# Patient Record
Sex: Female | Born: 1950 | Race: White | Hispanic: No | Marital: Married | Smoking: Never smoker
Health system: Southern US, Community
[De-identification: ages and names within clinical notes are randomized; demographics above are authoritative.]

## PROBLEM LIST (undated history)

## (undated) DIAGNOSIS — Z9889 Other specified postprocedural states: Secondary | ICD-10-CM

## (undated) DIAGNOSIS — T8859XA Other complications of anesthesia, initial encounter: Secondary | ICD-10-CM

## (undated) DIAGNOSIS — Z87442 Personal history of urinary calculi: Secondary | ICD-10-CM

## (undated) DIAGNOSIS — K529 Noninfective gastroenteritis and colitis, unspecified: Secondary | ICD-10-CM

## (undated) DIAGNOSIS — N390 Urinary tract infection, site not specified: Secondary | ICD-10-CM

## (undated) DIAGNOSIS — T4145XA Adverse effect of unspecified anesthetic, initial encounter: Secondary | ICD-10-CM

## (undated) DIAGNOSIS — M1990 Unspecified osteoarthritis, unspecified site: Secondary | ICD-10-CM

## (undated) DIAGNOSIS — N200 Calculus of kidney: Secondary | ICD-10-CM

## (undated) DIAGNOSIS — R112 Nausea with vomiting, unspecified: Secondary | ICD-10-CM

## (undated) DIAGNOSIS — N3010 Interstitial cystitis (chronic) without hematuria: Secondary | ICD-10-CM

## (undated) HISTORY — DX: Urinary tract infection, site not specified: N39.0

## (undated) HISTORY — PX: BREAST BIOPSY: SHX20

## (undated) HISTORY — PX: ABDOMINAL HYSTERECTOMY: SHX81

## (undated) HISTORY — DX: Calculus of kidney: N20.0

## (undated) HISTORY — PX: JOINT REPLACEMENT: SHX530

## (undated) HISTORY — DX: Interstitial cystitis (chronic) without hematuria: N30.10

## (undated) HISTORY — DX: Noninfective gastroenteritis and colitis, unspecified: K52.9

---

## 1988-02-06 HISTORY — PX: PARTIAL HYSTERECTOMY: SHX80

## 1989-02-05 HISTORY — PX: APPENDECTOMY: SHX54

## 1999-02-07 ENCOUNTER — Encounter: Admission: RE | Admit: 1999-02-07 | Discharge: 1999-02-07 | Payer: Self-pay | Admitting: Gynecology

## 1999-02-07 ENCOUNTER — Encounter: Payer: Self-pay | Admitting: Gynecology

## 1999-09-21 ENCOUNTER — Other Ambulatory Visit: Admission: RE | Admit: 1999-09-21 | Discharge: 1999-09-21 | Payer: Self-pay | Admitting: Gynecology

## 2000-02-15 ENCOUNTER — Encounter: Payer: Self-pay | Admitting: Gynecology

## 2000-02-15 ENCOUNTER — Encounter: Admission: RE | Admit: 2000-02-15 | Discharge: 2000-02-15 | Payer: Self-pay | Admitting: Gynecology

## 2001-02-21 ENCOUNTER — Encounter: Admission: RE | Admit: 2001-02-21 | Discharge: 2001-02-21 | Payer: Self-pay | Admitting: Gynecology

## 2001-02-21 ENCOUNTER — Encounter: Payer: Self-pay | Admitting: Gynecology

## 2002-02-23 ENCOUNTER — Encounter: Payer: Self-pay | Admitting: Gynecology

## 2002-02-23 ENCOUNTER — Encounter: Admission: RE | Admit: 2002-02-23 | Discharge: 2002-02-23 | Payer: Self-pay | Admitting: Gynecology

## 2002-10-26 ENCOUNTER — Other Ambulatory Visit: Admission: RE | Admit: 2002-10-26 | Discharge: 2002-10-26 | Payer: Self-pay | Admitting: Gynecology

## 2003-03-05 ENCOUNTER — Encounter: Admission: RE | Admit: 2003-03-05 | Discharge: 2003-03-05 | Payer: Self-pay | Admitting: Gynecology

## 2004-03-06 ENCOUNTER — Encounter: Admission: RE | Admit: 2004-03-06 | Discharge: 2004-03-06 | Payer: Self-pay | Admitting: Gynecology

## 2004-03-17 ENCOUNTER — Encounter: Admission: RE | Admit: 2004-03-17 | Discharge: 2004-03-17 | Payer: Self-pay | Admitting: Gynecology

## 2005-03-12 ENCOUNTER — Encounter: Admission: RE | Admit: 2005-03-12 | Discharge: 2005-03-12 | Payer: Self-pay | Admitting: Gynecology

## 2006-03-13 ENCOUNTER — Encounter: Admission: RE | Admit: 2006-03-13 | Discharge: 2006-03-13 | Payer: Self-pay | Admitting: Gynecology

## 2007-03-17 ENCOUNTER — Encounter: Admission: RE | Admit: 2007-03-17 | Discharge: 2007-03-17 | Payer: Self-pay | Admitting: Gynecology

## 2007-03-25 ENCOUNTER — Encounter: Admission: RE | Admit: 2007-03-25 | Discharge: 2007-03-25 | Payer: Self-pay | Admitting: Gynecology

## 2008-03-17 ENCOUNTER — Encounter: Admission: RE | Admit: 2008-03-17 | Discharge: 2008-03-17 | Payer: Self-pay | Admitting: Gynecology

## 2009-03-21 ENCOUNTER — Encounter: Admission: RE | Admit: 2009-03-21 | Discharge: 2009-03-21 | Payer: Self-pay | Admitting: Gynecology

## 2010-02-25 ENCOUNTER — Other Ambulatory Visit: Payer: Self-pay | Admitting: Gynecology

## 2010-02-25 DIAGNOSIS — Z1231 Encounter for screening mammogram for malignant neoplasm of breast: Secondary | ICD-10-CM

## 2010-03-22 ENCOUNTER — Ambulatory Visit
Admission: RE | Admit: 2010-03-22 | Discharge: 2010-03-22 | Disposition: A | Discharge status: Home / Self Care | Payer: BC Managed Care – PPO | Source: Ambulatory Visit | Attending: Gynecology | Admitting: Gynecology

## 2010-03-22 DIAGNOSIS — Z1231 Encounter for screening mammogram for malignant neoplasm of breast: Secondary | ICD-10-CM

## 2010-03-28 ENCOUNTER — Other Ambulatory Visit: Payer: Self-pay | Admitting: Gynecology

## 2010-03-28 DIAGNOSIS — R928 Other abnormal and inconclusive findings on diagnostic imaging of breast: Secondary | ICD-10-CM

## 2010-03-29 ENCOUNTER — Ambulatory Visit
Admission: RE | Admit: 2010-03-29 | Discharge: 2010-03-29 | Disposition: A | Discharge status: Home / Self Care | Payer: BC Managed Care – PPO | Source: Ambulatory Visit | Attending: Gynecology | Admitting: Gynecology

## 2010-03-29 DIAGNOSIS — R928 Other abnormal and inconclusive findings on diagnostic imaging of breast: Secondary | ICD-10-CM

## 2011-02-20 ENCOUNTER — Other Ambulatory Visit: Payer: Self-pay | Admitting: Gynecology

## 2011-02-20 DIAGNOSIS — Z1231 Encounter for screening mammogram for malignant neoplasm of breast: Secondary | ICD-10-CM

## 2011-03-01 ENCOUNTER — Other Ambulatory Visit: Payer: Self-pay | Admitting: Gynecology

## 2011-03-08 ENCOUNTER — Ambulatory Visit
Admission: RE | Admit: 2011-03-08 | Discharge: 2011-03-08 | Disposition: A | Discharge status: Home / Self Care | Payer: BC Managed Care – PPO | Source: Ambulatory Visit | Attending: Gynecology | Admitting: Gynecology

## 2011-04-02 ENCOUNTER — Ambulatory Visit
Admission: RE | Admit: 2011-04-02 | Discharge: 2011-04-02 | Disposition: A | Discharge status: Home / Self Care | Payer: BC Managed Care – PPO | Source: Ambulatory Visit | Attending: Gynecology | Admitting: Gynecology

## 2011-04-02 DIAGNOSIS — Z1231 Encounter for screening mammogram for malignant neoplasm of breast: Secondary | ICD-10-CM

## 2011-05-09 ENCOUNTER — Other Ambulatory Visit: Payer: Self-pay | Admitting: Gynecology

## 2011-05-09 DIAGNOSIS — N6009 Solitary cyst of unspecified breast: Secondary | ICD-10-CM

## 2011-05-14 ENCOUNTER — Other Ambulatory Visit: Payer: Self-pay | Admitting: Diagnostic Radiology

## 2011-05-14 ENCOUNTER — Ambulatory Visit
Admission: RE | Admit: 2011-05-14 | Discharge: 2011-05-14 | Disposition: A | Discharge status: Home / Self Care | Payer: BC Managed Care – PPO | Source: Ambulatory Visit | Attending: Gynecology | Admitting: Gynecology

## 2011-05-14 ENCOUNTER — Other Ambulatory Visit: Payer: Self-pay | Admitting: Gynecology

## 2011-05-14 DIAGNOSIS — N6009 Solitary cyst of unspecified breast: Secondary | ICD-10-CM

## 2011-11-08 ENCOUNTER — Encounter: Payer: Self-pay | Admitting: Internal Medicine

## 2012-03-03 ENCOUNTER — Other Ambulatory Visit: Payer: Self-pay | Admitting: Gynecology

## 2012-03-03 DIAGNOSIS — Z1231 Encounter for screening mammogram for malignant neoplasm of breast: Secondary | ICD-10-CM

## 2012-04-03 ENCOUNTER — Ambulatory Visit
Admission: RE | Admit: 2012-04-03 | Discharge: 2012-04-03 | Disposition: A | Discharge status: Home / Self Care | Payer: BC Managed Care – PPO | Source: Ambulatory Visit | Attending: Gynecology | Admitting: Gynecology

## 2012-04-03 DIAGNOSIS — Z1231 Encounter for screening mammogram for malignant neoplasm of breast: Secondary | ICD-10-CM

## 2012-05-16 ENCOUNTER — Encounter: Payer: Self-pay | Admitting: Internal Medicine

## 2012-07-11 ENCOUNTER — Ambulatory Visit (AMBULATORY_SURGERY_CENTER): Payer: BC Managed Care – PPO | Admitting: *Deleted

## 2012-07-11 ENCOUNTER — Encounter: Payer: Self-pay | Admitting: Internal Medicine

## 2012-07-11 VITALS — Ht 66.0 in | Wt 136.0 lb

## 2012-07-11 DIAGNOSIS — Z1211 Encounter for screening for malignant neoplasm of colon: Secondary | ICD-10-CM

## 2012-07-11 MED ORDER — MOVIPREP 100 G PO SOLR: ORAL | Status: DC

## 2012-07-11 NOTE — Progress Notes (Signed)
Patient concerned about hemorrhoids bleeding like during prep 10 years ago. Explained to patient OTC meds. To use and sitz bath to help. Also encouraged patient to call us or 911 if bleeding large amts or blood clots. She understands. Patient denies any problems with hemorrhoids currently, only when doing the colon prep.

## 2012-07-16 ENCOUNTER — Telehealth: Payer: Self-pay | Admitting: Internal Medicine

## 2012-07-16 NOTE — Telephone Encounter (Signed)
No charge. 

## 2012-07-18 ENCOUNTER — Encounter: Payer: BC Managed Care – PPO | Admitting: Internal Medicine

## 2013-01-05 ENCOUNTER — Telehealth: Payer: Self-pay | Admitting: Internal Medicine

## 2013-01-05 NOTE — Telephone Encounter (Signed)
Spoke with patient. She has had diarrhea when she has a bowel movement since September. No pain, gas or strong odor just loose stools. Wants a Friday OV. Scheduled patient on 01/09/13 at 3:30 PM with  Doug Sou, PA.

## 2013-01-08 ENCOUNTER — Encounter: Payer: Self-pay | Admitting: Gastroenterology

## 2013-01-08 ENCOUNTER — Ambulatory Visit (INDEPENDENT_AMBULATORY_CARE_PROVIDER_SITE_OTHER)
Admission: RE | Admit: 2013-01-08 | Discharge: 2013-01-08 | Disposition: A | Discharge status: Home / Self Care | Payer: BC Managed Care – PPO | Source: Ambulatory Visit | Attending: Gastroenterology | Admitting: Gastroenterology

## 2013-01-08 ENCOUNTER — Other Ambulatory Visit (INDEPENDENT_AMBULATORY_CARE_PROVIDER_SITE_OTHER): Payer: BC Managed Care – PPO

## 2013-01-08 ENCOUNTER — Ambulatory Visit (INDEPENDENT_AMBULATORY_CARE_PROVIDER_SITE_OTHER): Payer: BC Managed Care – PPO | Admitting: Gastroenterology

## 2013-01-08 VITALS — BP 116/80 | HR 72 | Ht 66.0 in | Wt 129.0 lb

## 2013-01-08 DIAGNOSIS — R1032 Left lower quadrant pain: Secondary | ICD-10-CM

## 2013-01-08 DIAGNOSIS — K625 Hemorrhage of anus and rectum: Secondary | ICD-10-CM

## 2013-01-08 DIAGNOSIS — R197 Diarrhea, unspecified: Secondary | ICD-10-CM

## 2013-01-08 LAB — CBC WITH DIFFERENTIAL/PLATELET
Basophils Absolute: 0 10*3/uL (ref 0.0–0.1)
Eosinophils Absolute: 0 10*3/uL (ref 0.0–0.7)
Hemoglobin: 14.2 g/dL (ref 12.0–15.0)
Lymphocytes Relative: 6.5 % — ABNORMAL LOW (ref 12.0–46.0)
MCHC: 33.5 g/dL (ref 30.0–36.0)
Monocytes Relative: 2.4 % — ABNORMAL LOW (ref 3.0–12.0)
Neutro Abs: 7.1 10*3/uL (ref 1.4–7.7)
Neutrophils Relative %: 90.8 % — ABNORMAL HIGH (ref 43.0–77.0)
RDW: 12.8 % (ref 11.5–14.6)

## 2013-01-08 LAB — COMPREHENSIVE METABOLIC PANEL
ALT: 18 U/L (ref 0–35)
AST: 18 U/L (ref 0–37)
Albumin: 3.6 g/dL (ref 3.5–5.2)
Calcium: 8.8 mg/dL (ref 8.4–10.5)
Chloride: 103 mEq/L (ref 96–112)
Potassium: 4 mEq/L (ref 3.5–5.1)
Sodium: 137 mEq/L (ref 135–145)

## 2013-01-08 MED ORDER — IOHEXOL 300 MG/ML  SOLN
100.0000 mL | Freq: Once | INTRAMUSCULAR | Status: AC | PRN
  Administered 2013-01-08: 100 mL via INTRAVENOUS

## 2013-01-08 MED ORDER — DICYCLOMINE HCL 10 MG PO CAPS: 10.0000 mg | ORAL_CAPSULE | Freq: Three times a day (TID) | ORAL | Status: DC

## 2013-01-08 NOTE — Patient Instructions (Addendum)
You have been scheduled for a CT scan of the abdomen and pelvis at [facility] ([address]---this is in the same building as [facility]).   You are scheduled on 01/08/2013 at 4pm. You should arrive 15 minutes prior to your appointment time for registration. Please follow the written instructions below on the day of your exam:  WARNING: IF YOU ARE ALLERGIC TO IODINE/X-RAY DYE, PLEASE NOTIFY RADIOLOGY IMMEDIATELY AT [phone]! YOU WILL BE GIVEN A 13 HOUR PREMEDICATION PREP.  1) Do not eat or drink anything after 12pm (4 hours prior to your test) 2) You have been given 2 bottles of oral contrast to drink. The solution may taste               better if refrigerated, but do NOT add ice or any other liquid to this solution. Shake             well before drinking.    Drink 1 bottle of contrast @ 2pm (2 hours prior to your exam)  Drink 1 bottle of contrast @ 3pm (1 hour prior to your exam)  You may take any medications as prescribed with a small amount of water except for the following: Metformin, Glucophage, Glucovance, Avandamet, Riomet, Fortamet, Actoplus Met, Janumet, Glumetza or Metaglip. The above medications must be held the day of the exam AND 48 hours after the exam.  The purpose of you drinking the oral contrast is to aid in the visualization of your intestinal tract. The contrast solution may cause some diarrhea. Before your exam is started, you will be given a small amount of fluid to drink. Depending on your individual set of symptoms, you may also receive an intravenous injection of x-ray contrast/dye. Plan on being at [facility] for 30 minutes or long, depending on the type of exam you are having performed.  This test typically takes 30-45 minutes to complete.  If you have any questions regarding your exam or if you need to reschedule, you may call the CT department at [phone] between the hours of 8:00 am and 5:00 pm,  Monday-Friday.  ________________________________________________________________________ Go to the basement for labs today.

## 2013-01-08 NOTE — Progress Notes (Signed)
Reviewed and agree with plans and assessment.

## 2013-01-08 NOTE — Progress Notes (Signed)
01/08/2013 Brandi Hines [id number] [birth year]   HISTORY OF PRESENT ILLNESS:  This is a pleasant 62 year old female who is known to Dr. Juanda Chance for colonoscopy in 12/2001. At that time she did have some erythema noted in the rectum, but biopsies were normal. She also had some hemorrhoids. Repeat was recommended in 10 years from that time. She is obviously overdue for the procedure, but states that her parents are elderly and she does take care of them and they had some health issues recently. Anyways, she comes in today with complaints of diarrhea for proximal last 5 weeks. She states that around the time the diarrhea began she was placed on some Synthroid medication, which she thought maybe causing diarrhea. That was discontinued after approximately a month and she still continues to have diarrhea. She says that she did go on a cruise prior to the diarrhea starting but she did not eat food that was not prepared or served on a cruise ship. She says that throughout the course of this diarrhea she would have some lower abdominal cramps, which were relieved by having a bowel movement. She is having 4-5 loose or watery bowel movements a day. She does take Imodium which helps temporarily. Very rare nocturnal bowel movements. This morning she woke up around 6:30 AM with lower abdominal cramping particularly in the left side radiating to the center that was reaching an intensity of 8/10 on the pain scale. She also noted some bright red blood and some mucousy material with her bowel movements today as well. She had not had any blood previously throughout the course of this diarrhea.  She denies fevers or chills. She had some dry heaves this morning, but otherwise no vomiting. She states that her appetite is good and she would like to eat but has been skeptical to eat several things due to fear of worsening the diarrhea    Past Medical History  Diagnosis Date  . Interstitial cystitis   . Kidney stones   . UTI  (lower urinary tract infection)    Past Surgical History  Procedure Laterality Date  . Partial hysterectomy  1990  . Appendectomy  1991    reports that she has never smoked. She has never used smokeless tobacco. She reports that she does not drink alcohol or use illicit drugs. family history includes Prostate cancer in her father. There is no history of Colon cancer or Colon polyps. No Known Allergies    Outpatient Encounter Prescriptions as of 01/08/2013  Medication Sig  . Cetirizine HCl (KLS ALLER-TEC PO) Take 1 tablet by mouth daily.  Marland Kitchen estrogens, conjugated, (PREMARIN) 0.45 MG tablet Take 0.45 mg by mouth daily. Take daily for 21 days then do not take for 7 days.  . [DISCONTINUED] MOVIPREP 100 G SOLR Take as directed.  . [DISCONTINUED] pentosan polysulfate (ELMIRON) 100 MG capsule Take 100 mg by mouth as directed.     REVIEW OF SYSTEMS  : All other systems reviewed and negative except where noted in the History of Present Illness.   PHYSICAL EXAM: BP 116/80  Pulse 72  Ht 5\' 6"  (1.676 m)  Wt 129 lb (58.514 kg)  BMI 20.83 kg/m2 General: Well developed white female in no acute distress Head: Normocephalic and atraumatic Eyes:  Sclerae anicteric, conjunctiva pink. Ears: Normal auditory acuity. Lungs: Clear throughout to auscultation Heart: Regular rate and rhythm Abdomen: Soft, non-distended.  Normal bowel sounds.  Lower abdominal TTP particularly in the LLQ and suprapubic region. Rectal:  DRE did not  reveal any masses.  There was trace amount of light brown stool on the exam glove, no blood.  Stool was heme positive, however. Musculoskeletal: Symmetrical with no gross deformities  Skin: No lesions on visible extremities Extremities: No edema  Neurological: Alert oriented x 4, grossly non-focal. Psychological:  Alert and cooperative. Normal mood and affect  ASSESSMENT AND PLAN: -Acute onset lower abdominal pain, particularly left lower quadrant with some mild rectal  bleeding -Diarrhea, present for a proximal 5 weeks  *We will order some labs including CBC and CMP. We'll have a CAT scan of abdomen and pelvis with contrast performed today to rule out diverticulitis, colitis of some sort, etc. We'll give her prescription for Bentyl 10 mg to begin taking 3 times daily as needed for the cramping. We will schedule her colonoscopy today as well since she is overdue for that and since that may be the next in evaluation if her CT scan is negative.

## 2013-01-09 ENCOUNTER — Other Ambulatory Visit: Payer: Self-pay | Admitting: *Deleted

## 2013-01-09 ENCOUNTER — Other Ambulatory Visit: Payer: BC Managed Care – PPO

## 2013-01-09 ENCOUNTER — Ambulatory Visit: Payer: BC Managed Care – PPO | Admitting: Gastroenterology

## 2013-01-09 DIAGNOSIS — K5289 Other specified noninfective gastroenteritis and colitis: Secondary | ICD-10-CM

## 2013-01-09 MED ORDER — CIPROFLOXACIN HCL 500 MG PO TABS: ORAL_TABLET | ORAL | Status: DC

## 2013-01-09 MED ORDER — METRONIDAZOLE 500 MG PO TABS: ORAL_TABLET | ORAL | Status: DC

## 2013-01-12 LAB — GASTROINTESTINAL PATHOGEN PANEL PCR
Campylobacter, PCR: NEGATIVE
E coli (STEC) stx1/stx2, PCR: NEGATIVE
E coli 0157, PCR: NEGATIVE
Giardia lamblia, PCR: NEGATIVE
Rotavirus A, PCR: NEGATIVE
Salmonella, PCR: NEGATIVE
Shigella, PCR: NEGATIVE

## 2013-01-13 ENCOUNTER — Encounter: Payer: Self-pay | Admitting: Internal Medicine

## 2013-01-22 ENCOUNTER — Other Ambulatory Visit: Payer: Self-pay | Admitting: Gastroenterology

## 2013-01-22 NOTE — Telephone Encounter (Signed)
Please advise regarding refill, thank you. 

## 2013-01-22 NOTE — Telephone Encounter (Signed)
Ok to refill dicyclomine.  Jess

## 2013-02-03 ENCOUNTER — Telehealth: Payer: Self-pay | Admitting: Gastroenterology

## 2013-02-03 NOTE — Telephone Encounter (Signed)
Haven't heard from Dr. Juanda Chance.  Please reschedule the patient's procedure for 1/8 at [facility] with conscious sedation.  Thank you,  Jess

## 2013-02-03 NOTE — Telephone Encounter (Signed)
Pt saw Doug Sou, PA on 12 /4/14 for abd pain LLQ, diarrhea x 5 weeks and rectal bleeding. Labs were drawn and a CT ordered that showed : 1. Abnormal thickening of the wall and enhancement of mucosa of the  right colon transverse colon and proximal sigmoid colon. Findings  are consistent with long segment significant colitis. No pericolonic  abscess or mesenteric fluid collection. Pathogen panel was normal and pt was placed on Cipro and Flagyl which she has completed. Pt has a COLON scheduled for 03/13/13 with Dr Juanda Chance. Pt reports cramping and bleeding for the 1st time since 01/08/13. She is taking Dicyclomine QID. She has a little formed stool, mostly it's blood and mucus. Shanda Bumps, please advise. Thanks.

## 2013-02-04 ENCOUNTER — Other Ambulatory Visit: Payer: Self-pay | Admitting: *Deleted

## 2013-02-04 DIAGNOSIS — R197 Diarrhea, unspecified: Secondary | ICD-10-CM

## 2013-02-04 DIAGNOSIS — K625 Hemorrhage of anus and rectum: Secondary | ICD-10-CM

## 2013-02-04 DIAGNOSIS — R109 Unspecified abdominal pain: Secondary | ICD-10-CM

## 2013-02-04 MED ORDER — PEG-KCL-NACL-NASULF-NA ASC-C 100 G PO SOLR: 1.0000 | Freq: Once | ORAL | Status: DC

## 2013-02-04 NOTE — Telephone Encounter (Signed)
Orders/case in computer, but no one at [facility] END[facility] to schedule. Informed pt I will call her 02/06/13 with final appt and she stated understanding.

## 2013-02-04 NOTE — Telephone Encounter (Signed)
Dr Juanda Chance, the room at [facility] is not open until before your 10am case and then after Dr Pyrtle's case at 11:30am. Dr Lauro Franklin case is a difficult one with intubation and I don't know how long it will take. Please help with a time if you want pt's case moved up to 02/12/13 as Shanda Bumps suggested. Just found out Dr Christella Hartigan is Hospital doc that day and won't be using his room. OK to have you follow in room 4 at 11am? Thanks, Aram Beecham

## 2013-02-04 NOTE — Telephone Encounter (Signed)
OK to schedule colon for 02/12/2013 at 11.00 am at [facility], conscious sedation

## 2013-02-06 NOTE — Progress Notes (Signed)
Pt left a message stating she got my VM and wonders if the prep has been called in. Spoke with pt to inform her Movi Prep is ready at [facility]; pt stated understanding.

## 2013-02-06 NOTE — Telephone Encounter (Signed)
Scheduled pt at [facility] and mailed new prep instructions to pt. lmom informing pt; she may call back for questions.

## 2013-02-07 ENCOUNTER — Other Ambulatory Visit: Payer: Self-pay | Admitting: Gastroenterology

## 2013-02-09 ENCOUNTER — Other Ambulatory Visit: Payer: Self-pay | Admitting: Gastroenterology

## 2013-02-09 NOTE — Telephone Encounter (Signed)
Please advise regarding refill, thank you. 

## 2013-02-11 NOTE — H&P (Signed)
HISTORY OF PRESENT ILLNESS:  This is a pleasant 63 year old female who is known to Dr. Juanda Chance for colonoscopy in 12/2001. At that time she did have some erythema noted in the rectum, but biopsies were normal. She also had some hemorrhoids. Repeat was recommended in 10 years from that time. She is obviously overdue for the procedure, but states that her parents are elderly and she does take care of them and they had some health issues recently. Anyways, she comes in today with complaints of diarrhea for proximal last 5 weeks. She states that around the time the diarrhea began she was placed on some Synthroid medication, which she thought maybe causing diarrhea. That was discontinued after approximately a month and she still continues to have diarrhea. She says that she did go on a cruise prior to the diarrhea starting but she did not eat food that was not prepared or served on a cruise ship. She says that throughout the course of this diarrhea she would have some lower abdominal cramps, which were relieved by having a bowel movement. She is having 4-5 loose or watery bowel movements a day. She does take Imodium which helps temporarily. Very rare nocturnal bowel movements. This morning she woke up around 6:30 AM with lower abdominal cramping particularly in the left side radiating to the center that was reaching an intensity of 8/10 on the pain scale. She also noted some bright red blood and some mucousy material with her bowel movements today as well. She had not had any blood previously throughout the course of this diarrhea.  She denies fevers or chills. She had some dry heaves this morning, but otherwise no vomiting. She states that her appetite is good and she would like to eat but has been skeptical to eat several things due to fear of worsening the diarrhea        Past Medical History   Diagnosis  Date   .  Interstitial cystitis     .  Kidney stones     .  UTI (lower urinary tract infection)         Past Surgical History   Procedure  Laterality  Date   .  Partial hysterectomy    1990   .  Appendectomy    1991       reports that she has never smoked. She has never used smokeless tobacco. She reports that she does not drink alcohol or use illicit drugs. family history includes Prostate cancer in her father. There is no history of Colon cancer or Colon polyps. No Known Allergies      Outpatient Encounter Prescriptions as of 01/08/2013   Medication  Sig   .  Cetirizine HCl (KLS ALLER-TEC PO)  Take 1 tablet by mouth daily.   Marland Kitchen  estrogens, conjugated, (PREMARIN) 0.45 MG tablet  Take 0.45 mg by mouth daily. Take daily for 21 days then do not take for 7 days.   .  [DISCONTINUED] MOVIPREP 100 G SOLR  Take as directed.   .  [DISCONTINUED] pentosan polysulfate (ELMIRON) 100 MG capsule  Take 100 mg by mouth as directed.          REVIEW OF SYSTEMS  : All other systems reviewed and negative except where noted in the History of Present Illness.     PHYSICAL EXAM: BP 116/80  Pulse 72  Ht 5\' 6"  (1.676 m)  Wt 129 lb (58.514 kg)  BMI 20.83 kg/m2 General: Well developed white female in no acute distress  Head: Normocephalic and atraumatic Eyes:  Sclerae anicteric, conjunctiva pink. Ears: Normal auditory acuity. Lungs: Clear throughout to auscultation Heart: Regular rate and rhythm Abdomen: Soft, non-distended.  Normal bowel sounds.  Lower abdominal TTP particularly in the LLQ and suprapubic region. Rectal:  DRE did not reveal any masses.  There was trace amount of light brown stool on the exam glove, no blood.  Stool was heme positive, however. Musculoskeletal: Symmetrical with no gross deformities   Skin: No lesions on visible extremities Extremities: No edema  Neurological: Alert oriented x 4, grossly non-focal. Psychological:  Alert and cooperative. Normal mood and affect   ASSESSMENT AND PLAN: -Acute onset lower abdominal pain, particularly left lower quadrant with some  mild rectal bleeding -Diarrhea, present for a proximal 5 weeks   *We will order some labs including CBC and CMP. We'll have a CAT scan of abdomen and pelvis with contrast performed today to rule out diverticulitis, colitis of some sort, etc. We'll give her prescription for Bentyl 10 mg to begin taking 3 times daily as needed for the cramping. We will schedule her colonoscopy today as well since she is overdue for that and since that may be the next in evaluation if her CT scan is negative.         Hart Carwin, MD at 01/08/2013 10:36 PM      Status: Signed            Reviewed and agree with plans and assessment.               Encounter-Level Documents:

## 2013-02-12 ENCOUNTER — Encounter (HOSPITAL_COMMUNITY)
Admission: RE | Disposition: A | Discharge status: Home / Self Care | Payer: Self-pay | Source: Ambulatory Visit | Attending: Internal Medicine

## 2013-02-12 ENCOUNTER — Encounter (HOSPITAL_COMMUNITY): Payer: Self-pay | Admitting: *Deleted

## 2013-02-12 ENCOUNTER — Ambulatory Visit (HOSPITAL_COMMUNITY)
Admission: RE | Admit: 2013-02-12 | Discharge: 2013-02-12 | Disposition: A | Discharge status: Home / Self Care | Payer: BC Managed Care – PPO | Source: Ambulatory Visit | Attending: Internal Medicine | Admitting: Internal Medicine

## 2013-02-12 DIAGNOSIS — Z9089 Acquired absence of other organs: Secondary | ICD-10-CM | POA: Insufficient documentation

## 2013-02-12 DIAGNOSIS — R1032 Left lower quadrant pain: Secondary | ICD-10-CM

## 2013-02-12 DIAGNOSIS — K5289 Other specified noninfective gastroenteritis and colitis: Secondary | ICD-10-CM | POA: Insufficient documentation

## 2013-02-12 DIAGNOSIS — Z79899 Other long term (current) drug therapy: Secondary | ICD-10-CM | POA: Insufficient documentation

## 2013-02-12 DIAGNOSIS — R109 Unspecified abdominal pain: Secondary | ICD-10-CM | POA: Insufficient documentation

## 2013-02-12 DIAGNOSIS — R197 Diarrhea, unspecified: Secondary | ICD-10-CM

## 2013-02-12 DIAGNOSIS — K625 Hemorrhage of anus and rectum: Secondary | ICD-10-CM | POA: Insufficient documentation

## 2013-02-12 HISTORY — PX: COLONOSCOPY: SHX5424

## 2013-02-12 SURGERY — COLONOSCOPY
Anesthesia: Moderate Sedation

## 2013-02-12 MED ORDER — FENTANYL CITRATE 0.05 MG/ML IJ SOLN
INTRAMUSCULAR | Status: AC
  Filled 2013-02-12: qty 2

## 2013-02-12 MED ORDER — FENTANYL CITRATE 0.05 MG/ML IJ SOLN
INTRAMUSCULAR | Status: DC | PRN
  Administered 2013-02-12: 25 ug via INTRAVENOUS
  Administered 2013-02-12: 12.5 ug via INTRAVENOUS
  Administered 2013-02-12 (×2): 25 ug via INTRAVENOUS

## 2013-02-12 MED ORDER — MIDAZOLAM HCL 10 MG/2ML IJ SOLN
INTRAMUSCULAR | Status: AC
  Filled 2013-02-12: qty 2

## 2013-02-12 MED ORDER — SODIUM CHLORIDE 0.9 % IV SOLN
INTRAVENOUS | Status: DC
  Administered 2013-02-12: 500 mL via INTRAVENOUS

## 2013-02-12 MED ORDER — MIDAZOLAM HCL 5 MG/5ML IJ SOLN
INTRAMUSCULAR | Status: DC | PRN
  Administered 2013-02-12 (×2): 1 mg via INTRAVENOUS
  Administered 2013-02-12 (×2): 2 mg via INTRAVENOUS

## 2013-02-12 NOTE — Interval H&P Note (Signed)
History and Physical Interval Note:  02/12/2013 10:57 AM  Brandi Hines  has presented today for surgery, with the diagnosis of Rectal bleeding [569.3] Abdominal pain [789.00] Diarrhea [787.91]  The various methods of treatment have been discussed with the patient and family. After consideration of risks, benefits and other options for treatment, the patient has consented to  Procedure(s): COLONOSCOPY (N/A) as a surgical intervention .  The patient's history has been reviewed, patient examined, no change in status, stable for surgery.  I have reviewed the patient's chart and labs.  Questions were answered to the patient's satisfaction.     [name]

## 2013-02-12 NOTE — Discharge Instructions (Signed)
Colonoscopy  Care After  Read the instructions outlined below and refer to this sheet in the next few weeks. These discharge instructions provide you with general information on caring for yourself after you leave the hospital. Your doctor may also give you specific instructions. While your treatment has been planned according to the most current medical practices available, unavoidable complications occasionally occur. If you have any problems or questions after discharge, call your doctor.  HOME CARE INSTRUCTIONS  ACTIVITY:  · You may resume your regular activity, but move at a slower pace for the next 24 hours.  · Take frequent rest periods for the next 24 hours.  · Walking will help get rid of the air and reduce the bloated feeling in your belly (abdomen).  · No driving for 24 hours (because of the medicine (anesthesia) used during the test).  · You may shower.  · Do not sign any important legal documents or operate any machinery for 24 hours (because of the anesthesia used during the test).  NUTRITION:  · Drink plenty of fluids.  · You may resume your normal diet as instructed by your doctor.  · Begin with a light meal and progress to your normal diet. Heavy or fried foods are harder to digest and may make you feel sick to your stomach (nauseated).  · Avoid alcoholic beverages for 24 hours or as instructed.  MEDICATIONS:  · You may resume your normal medications unless your doctor tells you otherwise.  WHAT TO EXPECT TODAY:  · Some feelings of bloating in the abdomen.  · Passage of more gas than usual.  · Spotting of blood in your stool or on the toilet paper.  IF YOU HAD POLYPS REMOVED DURING THE COLONOSCOPY:  · No aspirin products for 7 days or as instructed.  · No alcohol for 7 days or as instructed.  · Eat a soft diet for the next 24 hours.  FINDING OUT THE RESULTS OF YOUR TEST  Not all test results are available during your visit. If your test results are not back during the visit, make an appointment with  your caregiver to find out the results. Do not assume everything is normal if you have not heard from your caregiver or the medical facility. It is important for you to follow up on all of your test results.   SEEK IMMEDIATE MEDICAL CARE IF:  · You have more than a spotting of blood in your stool.  · Your belly is swollen (abdominal distention).  · You are nauseated or vomiting.  · You have a fever.  · You have abdominal pain or discomfort that is severe or gets worse throughout the day.  Document Released: 09/06/2003 Document Revised: 04/16/2011 Document Reviewed: 09/04/2007  ExitCare® Patient Information ©2014 [employer].

## 2013-02-12 NOTE — Op Note (Signed)
[facility] [address] [place], [address]   COLONOSCOPY PROCEDURE REPORT  PATIENT: Brandi Hines, Brandi Hines  MR#: [id number] BIRTHDATE: Oct 05, 1950 , 62  yrs. old GENDER: Female ENDOSCOPIST: Hart Carwin, MD REFERRED NW:[facility] Andrey Campanile, MD PROCEDURE DATE:  02/12/2013 PROCEDURE:   Colonoscopy with biopsy First Screening Colonoscopy - Avg.  risk and is 50 yrs.  old or older - No.  Prior Negative Screening - Now for repeat screening. 10 or more years since last screening  History of Adenoma - Now for follow-up colonoscopy & has been > or = to 3 yrs.  N/A  Polyps Removed Today? No.  Recommend repeat exam, <10 yrs? No. ASA CLASS:   Class I INDICATIONS:Average risk patient for colon cancer, Rectal Bleeding, and last colonoscopy in 2003 was normal.  Recent onset of diarrhea attributed to thyroid medication and Elmirone, has subsided. in recent weeks MEDICATIONS: These medications were titrated to patient response per physician's verbal order, Fentanyl-Detailed 87.5 mg IV, and Versed 6 mg IV  DESCRIPTION OF PROCEDURE:   After the risks benefits and alternatives of the procedure were thoroughly explained, informed consent was obtained.  A digital rectal exam revealed no abnormalities of the rectum.   The     endoscope was introduced through the anus and advanced to the cecum, which was identified by both the appendix and ileocecal valve. No adverse events experienced.   The quality of the prep was good, using MoviPrep The instrument was then slowly withdrawn as the colon was fully examined.      COLON FINDINGS: Abnormal mucosa was found in the sigmoid colon.there were patches of erythema from 10-40 cm without ulceration. They appeared quite nonspecific. There was no diverticulosis  Multiple random biopsies of the area were performed using cold forceps. Retroflexed views revealed no abnormalities. The time to cecum=not measured     .  Withdrawal time=not measured     .  The  scope was withdrawn and the procedure completed. COMPLICATIONS: There were no complications.  ENDOSCOPIC IMPRESSION: Abnormal mucosa was found in the sigmoid colon consistent with resolving nonspecific colitis; multiple random biopsies of the area were performed using cold forceps  RECOMMENDATIONS: 1.  Await pathology results 2.  high-fiber diet Recall colonoscopy in 10 years   eSigned:  Hart Carwin, MD 02/12/2013 11:35 AM   cc:   PATIENT NAME:  Brandi Hines, Brandi Hines MR#: [id number]

## 2013-02-13 ENCOUNTER — Encounter (HOSPITAL_COMMUNITY): Payer: Self-pay | Admitting: Internal Medicine

## 2013-02-14 ENCOUNTER — Encounter: Payer: Self-pay | Admitting: Internal Medicine

## 2013-02-16 ENCOUNTER — Other Ambulatory Visit: Payer: Self-pay | Admitting: *Deleted

## 2013-02-16 ENCOUNTER — Encounter: Payer: Self-pay | Admitting: *Deleted

## 2013-02-16 MED ORDER — BUDESONIDE 3 MG PO CP24
ORAL_CAPSULE | ORAL | Status: DC
Start: 2013-02-16 — End: 2013-03-26

## 2013-03-02 ENCOUNTER — Other Ambulatory Visit: Payer: Self-pay

## 2013-03-02 DIAGNOSIS — Z1231 Encounter for screening mammogram for malignant neoplasm of breast: Secondary | ICD-10-CM

## 2013-03-13 ENCOUNTER — Encounter: Payer: BC Managed Care – PPO | Admitting: Internal Medicine

## 2013-03-24 ENCOUNTER — Ambulatory Visit: Payer: BC Managed Care – PPO | Admitting: Internal Medicine

## 2013-03-26 ENCOUNTER — Ambulatory Visit (INDEPENDENT_AMBULATORY_CARE_PROVIDER_SITE_OTHER): Payer: BC Managed Care – PPO | Admitting: Internal Medicine

## 2013-03-26 ENCOUNTER — Other Ambulatory Visit (INDEPENDENT_AMBULATORY_CARE_PROVIDER_SITE_OTHER): Payer: BC Managed Care – PPO

## 2013-03-26 ENCOUNTER — Encounter: Payer: Self-pay | Admitting: Internal Medicine

## 2013-03-26 VITALS — BP 110/72 | HR 64 | Ht 66.0 in | Wt 127.4 lb

## 2013-03-26 DIAGNOSIS — K5289 Other specified noninfective gastroenteritis and colitis: Secondary | ICD-10-CM

## 2013-03-26 DIAGNOSIS — K52832 Lymphocytic colitis: Secondary | ICD-10-CM

## 2013-03-26 LAB — IGA: IgA: 332 mg/dL (ref 68–378)

## 2013-03-26 LAB — SEDIMENTATION RATE: Sed Rate: 14 mm/hr (ref 0–22)

## 2013-03-26 NOTE — Patient Instructions (Signed)
Your physician has requested that you go to the basement for the following lab work before leaving today: Sed Rate, IgA, TtG  CC: Dr Benedetto Goad

## 2013-03-26 NOTE — Progress Notes (Signed)
Brandi Hines Apr 25, 1950 [id number]  Note: This dictation was prepared with Dragon digital system. Any transcriptional errors that result from this procedure are unintentional.   History of Present Illness:  This is a 63 year old white female with a new diagnosis of lymphocytic colitis made on a colonoscopy on 02/12/2013 after she presented with diarrhea of several months duration. She has a history of irritable bowel syndrome and interstitial cystitis. Her diarrhea started after she took Elmiron for Interstitial Cystitis last fall.. The medication was discontinued. A prior colonoscopy in 2003 was normal. Random biopsies of the colon this time showed an increase in infiltrate in the lamina propria associated with increased intraepithelial lymphocytes,  consistent with microscopic colitis. There were no granulomas or crypt abscesses. Patient has responded to a course of Entocort  9 mg daily for 2 weeks reducing the dose to 6 mg a day for 2 weeks and down to 3 mg a day for 2 weeks which will be discontinued later this week. She is having regular bowel movements, back to her usual bowel habits of 3 bowel movements a day. There is no bleeding or abdominal pain.    Past Medical History  Diagnosis Date  . Interstitial cystitis   . Kidney stones   . UTI (lower urinary tract infection)   . Colitis     Past Surgical History  Procedure Laterality Date  . Partial hysterectomy  1990  . Appendectomy  1991  . Colonoscopy N/A 02/12/2013    Procedure: COLONOSCOPY;  Surgeon: Hart Carwin, MD;  Location: [facility] ENDOSCOPY;  Service: Endoscopy;  Laterality: N/A;    No Known Allergies  Family history and social history have been reviewed.  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 She has responded to a tapering course of Entocort. We will check her sprue profile today. We will also observe her for a recurrence of symptoms. She will let us know if the symptoms come back. She tolerated Entocort very well and her insurance covered it.. I will see her when necessary. Problem #2 IC- followed by a urologist   [name] 03/26/2013

## 2013-03-27 LAB — TISSUE TRANSGLUTAMINASE, IGA: Tissue Transglutaminase Ab, IgA: 4.1 U/mL (ref <20)

## 2013-03-30 ENCOUNTER — Telehealth: Payer: Self-pay | Admitting: Internal Medicine

## 2013-03-30 NOTE — Telephone Encounter (Signed)
Patient notified of results.

## 2013-03-30 NOTE — Telephone Encounter (Signed)
Sprue profile, IgA and sed rate all normal.

## 2013-03-30 NOTE — Telephone Encounter (Signed)
Patient is asking for results of labs. Please, advise.

## 2013-04-06 ENCOUNTER — Ambulatory Visit
Admission: RE | Admit: 2013-04-06 | Discharge: 2013-04-06 | Disposition: A | Discharge status: Home / Self Care | Payer: Self-pay | Source: Ambulatory Visit

## 2013-04-06 DIAGNOSIS — Z1231 Encounter for screening mammogram for malignant neoplasm of breast: Secondary | ICD-10-CM

## 2014-03-08 ENCOUNTER — Other Ambulatory Visit: Payer: Self-pay

## 2014-03-08 DIAGNOSIS — Z1231 Encounter for screening mammogram for malignant neoplasm of breast: Secondary | ICD-10-CM

## 2014-04-08 ENCOUNTER — Ambulatory Visit
Admission: RE | Admit: 2014-04-08 | Discharge: 2014-04-08 | Disposition: A | Discharge status: Home / Self Care | Payer: Medicare PPO | Source: Ambulatory Visit

## 2014-04-08 DIAGNOSIS — Z1231 Encounter for screening mammogram for malignant neoplasm of breast: Secondary | ICD-10-CM

## 2015-03-18 ENCOUNTER — Other Ambulatory Visit: Payer: Self-pay

## 2015-03-18 DIAGNOSIS — Z1231 Encounter for screening mammogram for malignant neoplasm of breast: Secondary | ICD-10-CM

## 2015-04-15 ENCOUNTER — Ambulatory Visit
Admission: RE | Admit: 2015-04-15 | Discharge: 2015-04-15 | Disposition: A | Discharge status: Home / Self Care | Payer: Medicare Other | Source: Ambulatory Visit

## 2015-04-15 DIAGNOSIS — Z1231 Encounter for screening mammogram for malignant neoplasm of breast: Secondary | ICD-10-CM

## 2015-09-20 IMAGING — CT CT ABD-PELV W/ CM
2 of 5 series · 17 of 46 positions shown, 19 images · IV contrast (Omnipaque 300)
Comparison: 11/08/2012

CLINICAL DATA: Diarrhea, bloody stool, lower pelvic pain

EXAM:
CT ABDOMEN AND PELVIS WITH CONTRAST
TECHNIQUE: Multidetector CT imaging of the abdomen and pelvis was performed
using the standard protocol following bolus administration of
intravenous contrast.
CONTRAST:  100mL OMNIPAQUE IOHEXOL 300 MG/ML  SOLN

[Series 2: abd/ pel 5mm · axial · 0.64mm/px · z∈[-454,-74]mm · 14 of 85 slices shown, 16 images]
[im 5/85  soft-tissue]
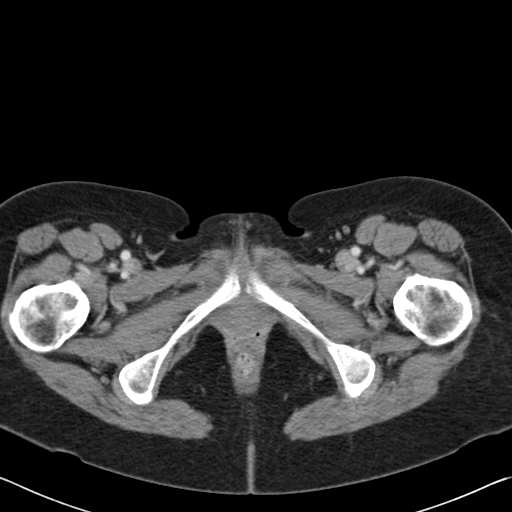
[im 5/85  bone]
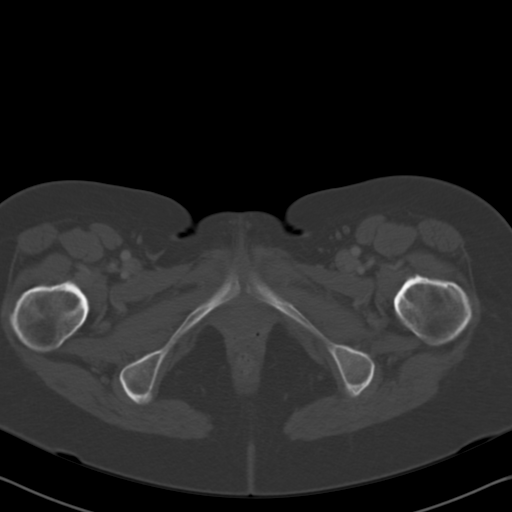
[im 13/85  soft-tissue]
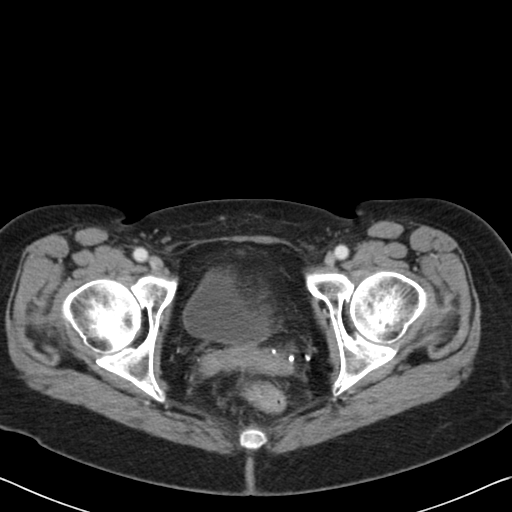
[im 17/85  soft-tissue]
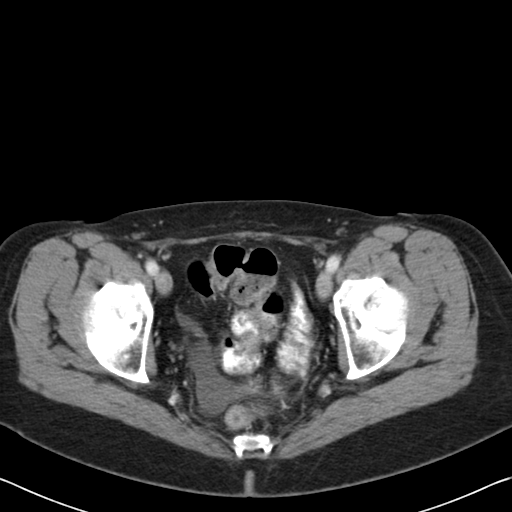
[im 25/85  soft-tissue]
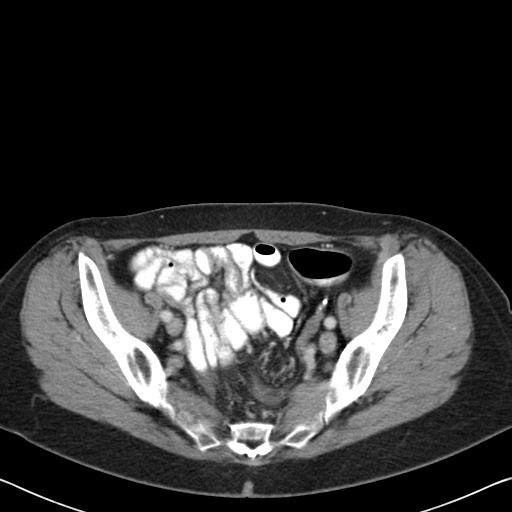
[im 29/85  soft-tissue]
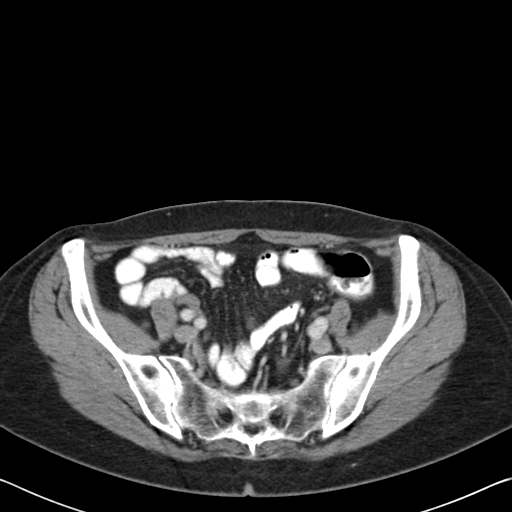
[im 33/85  soft-tissue]
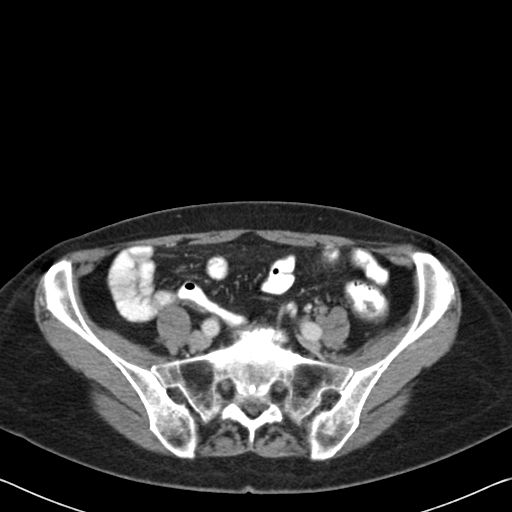
[im 41/85  soft-tissue]
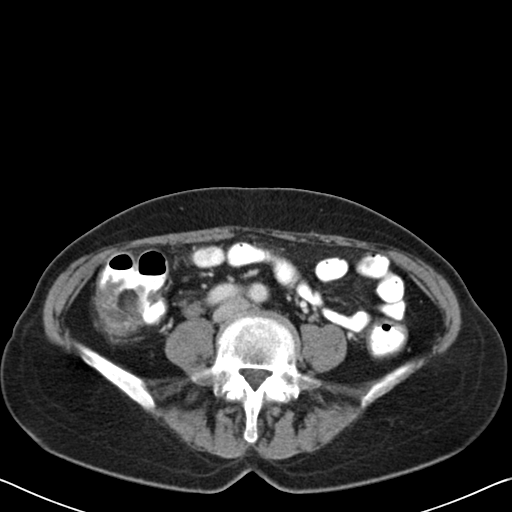
[im 45/85  soft-tissue]
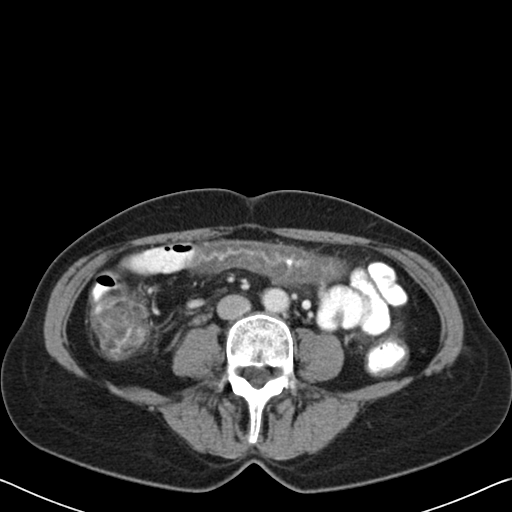
[im 53/85  soft-tissue]
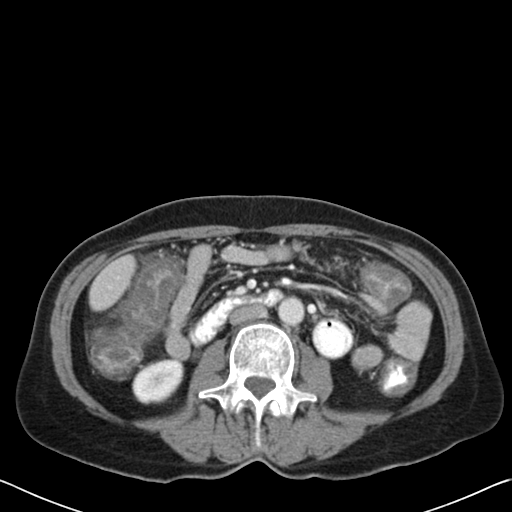
[im 53/85  bone]
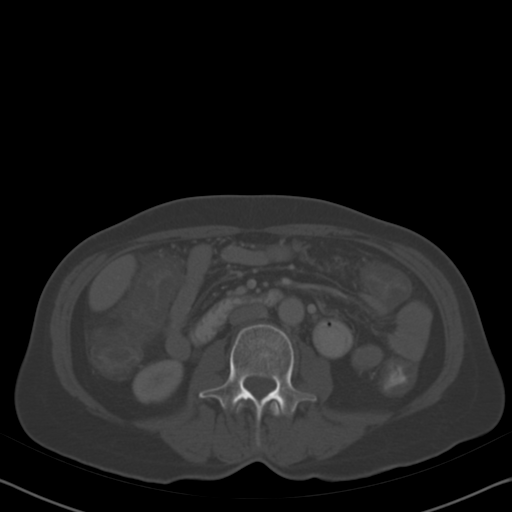
[im 57/85  soft-tissue]
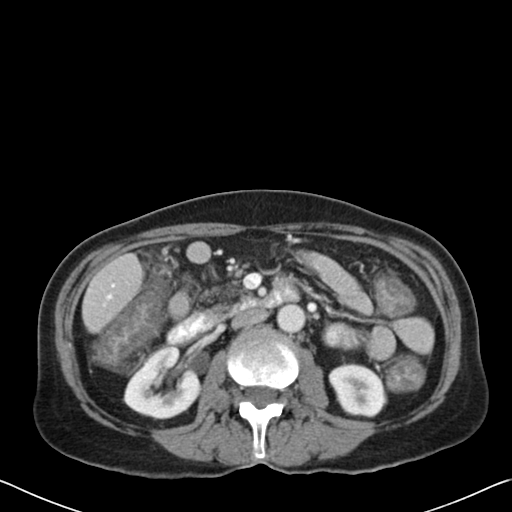
[im 65/85  soft-tissue]
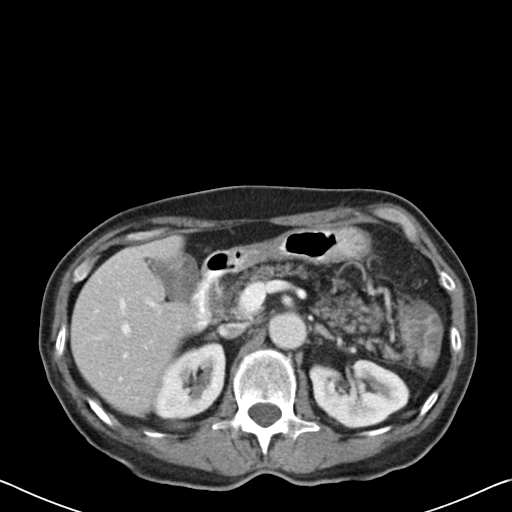
[im 69/85  soft-tissue]
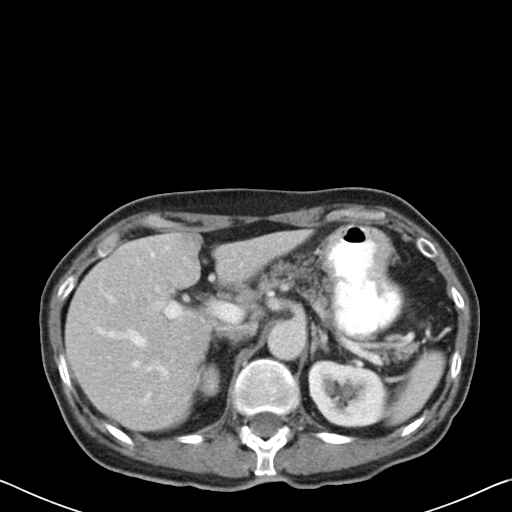
[im 73/85  soft-tissue]
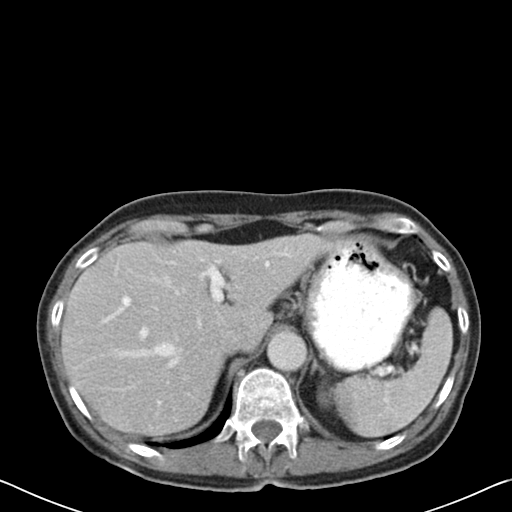
[im 81/85  soft-tissue]
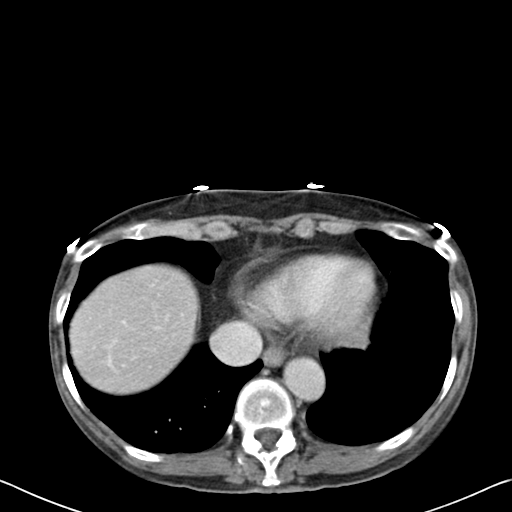

[Series 602: cor · coronal · 0.86mm/px · 3 of 92 slices shown]
[im 31/92  soft-tissue]
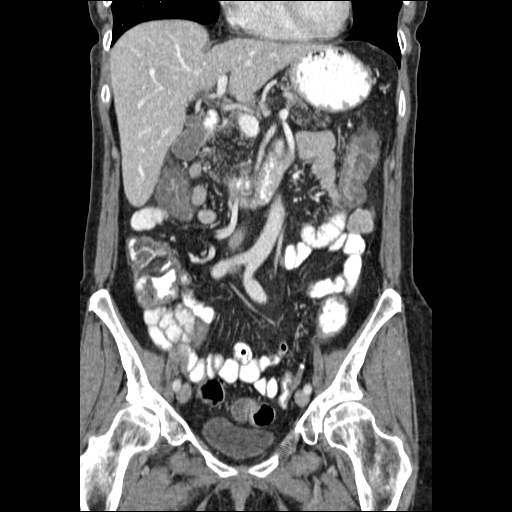
[im 41/92  soft-tissue]
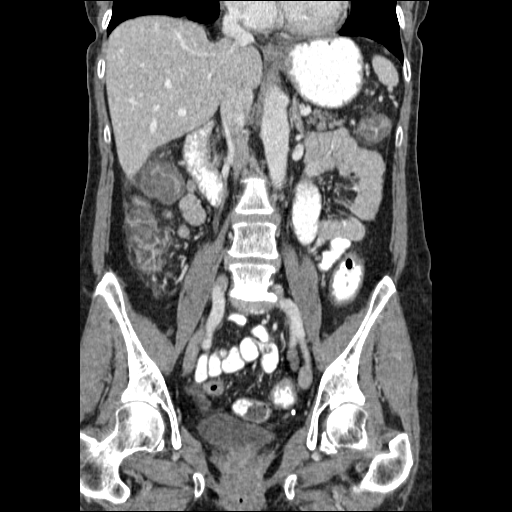
[im 51/92  soft-tissue]
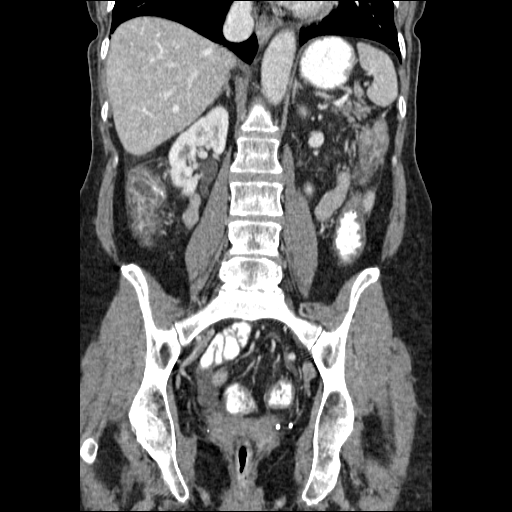

[17 of 46 positions shown; findings below may reference images not displayed]

FINDINGS: Lung bases are unremarkable. Sagittal images of the spine shows
significant disc space flattening with endplate sclerotic changes
and vacuum disc phenomenon at L5-S1 level.

Enhanced liver is unremarkable. No calcified gallstones are noted
within gallbladder. Mild fatty replaced pancreas. The spleen and
adrenal glands are unremarkable. Kidneys are symmetrical in size and
enhancement. No hydronephrosis or hydroureter. There is bilateral
renal symmetrical excretion on delayed images.

No aortic aneurysm. No small bowel obstruction. No ascites or free
air. There is significant thickening of right colonic wall
transverse colonic wall, splenic flexure wall and proximal left
colonic wall. Findings are consistent with extensive colitis. No
pericolonic abscess is noted. No mesenteric fluid collection. Small
amount of free fluid noted posterior or pelvis. The sigmoid colon
has a normal appearance. The patient is status post hysterectomy.
The patient is status post appendectomy. The terminal ileum is
unremarkable.

Urinary bladder is unremarkable. No destructive bony lesions are
noted within pelvis. No inguinal adenopathy.
IMPRESSION: 1. Abnormal thickening of the wall and enhancement of mucosa of the
right colon transverse colon and proximal sigmoid colon. Findings
are consistent with long segment significant colitis. No pericolonic
abscess or mesenteric fluid collection.
2. No hydronephrosis or hydroureter.
3. No small bowel obstruction.
4. Small amount of pelvic free fluid is noted.
5. Status post hysterectomy.

## 2016-03-12 ENCOUNTER — Other Ambulatory Visit: Payer: Self-pay | Admitting: Obstetrics

## 2016-03-12 DIAGNOSIS — Z1231 Encounter for screening mammogram for malignant neoplasm of breast: Secondary | ICD-10-CM

## 2016-04-16 ENCOUNTER — Ambulatory Visit: Payer: Medicare Other

## 2016-04-24 ENCOUNTER — Ambulatory Visit
Admission: RE | Admit: 2016-04-24 | Discharge: 2016-04-24 | Disposition: A | Discharge status: Home / Self Care | Payer: Medicare Other | Source: Ambulatory Visit | Attending: Obstetrics | Admitting: Obstetrics

## 2016-04-24 DIAGNOSIS — Z1231 Encounter for screening mammogram for malignant neoplasm of breast: Secondary | ICD-10-CM

## 2016-04-25 ENCOUNTER — Other Ambulatory Visit: Payer: Self-pay | Admitting: Obstetrics

## 2016-04-25 DIAGNOSIS — R928 Other abnormal and inconclusive findings on diagnostic imaging of breast: Secondary | ICD-10-CM

## 2016-04-26 ENCOUNTER — Ambulatory Visit (INDEPENDENT_AMBULATORY_CARE_PROVIDER_SITE_OTHER): Payer: Medicare Other | Admitting: Sports Medicine

## 2016-04-26 ENCOUNTER — Encounter: Payer: Self-pay | Admitting: Sports Medicine

## 2016-04-26 DIAGNOSIS — M25551 Pain in right hip: Secondary | ICD-10-CM

## 2016-04-26 DIAGNOSIS — M25552 Pain in left hip: Secondary | ICD-10-CM

## 2016-04-26 DIAGNOSIS — R29898 Other symptoms and signs involving the musculoskeletal system: Secondary | ICD-10-CM | POA: Diagnosis not present

## 2016-04-26 MED ORDER — AMITRIPTYLINE HCL 25 MG PO TABS: 25.0000 mg | ORAL_TABLET | Freq: Every day | ORAL | 1 refills | Status: DC

## 2016-04-26 NOTE — Progress Notes (Signed)
  Brandi Hines - 66 y.o. female MRN [id number]  Date of birth: [birth year]  SUBJECTIVE:  Including CC & ROS.  CC: bilateral hip pain  Pleasant 66 year old female who presents with bilateral hip pain right greater than left. He has been going on for a year but has worsened since December. She reports catching in the area and weakness with going up and down the stairs and with sitting on the toilet. She has been to an orthopedic doctor by [facility] orthopedics and had x-rays and MRI, which did not show any pathology significantly. She forgot the disc at home. They have tried injections in her hip bilaterally which have not helped.  She feels as though her pain and weakness are getting worse. She has also been sent to a rheumatologist, she had lab work done and it did not show any abnormalities. She does not have lab work with her today but does have an appointment with a rheumatologist on Monday. She is also getting a breast ultrasound and biopsy for some masses seen on her left breast. She had a mammogram this week. She does have dense breasts per the mammogram.     ROS: No unexpected weight loss, fever, chills, swelling, instability, muscle pain, numbness/tingling, redness, otherwise see HPI, +proximal LE weakness  PMHx - Updated and reviewed.  Contributory factors include: Negative PSHx - Updated and reviewed.  Contributory factors include:  Negative FHx - Updated and reviewed.  Contributory factors include:  Negative Social Hx - Updated and reviewed. Contributory factors include: Negative Medications - reviewed   DATA REVIEWED: Breast imaging reviewed, which showed possible left breast masses  PHYSICAL EXAM:  VS: BP:130/80  HR: bpm  TEMP: ( )  RESP:   HT:5\' 6"  (167.6 cm)   WT:135 lb (61.2 kg)  BMI:21.8 PHYSICAL EXAM: Gen: NAD, alert, cooperative with exam, well-appearing HEENT: clear conjunctiva,  CV:  no edema, capillary refill brisk, normal rate Resp: non-labored Skin: no rashes,  normal turgor  Neuro: no gross deficits.  Psych:  alert and oriented  Hip: ROM IR: 45 Deg, ER: 45 Deg, Flexion: 75 Deg, Extension: 100 Deg, Abduction: 45 Deg, Adduction: 45 Deg Strength IR: 4/5, ER: 4/5, Flexion: 4/5, Extension: 4/5, Abduction: 4/5, Adduction: 4/5 Pelvic alignment unremarkable to inspection and palpation. Standing hip rotation and gait without trendelenburg sign / unsteadiness. Greater trochanter with mild tenderness to palpation. No tenderness over piriformis. + pain with FABER or FADIR mostly on right. No SI joint tenderness and normal minimal SI movement. Knee flexion and extension and ankle flexion and extension have 5 out of 5 muscle strength bilaterally.   ASSESSMENT & PLAN:   Bilateral hip pain Amitriptyline at night.  Will get MRI/X-rays from [facility].  Lab results from Rheumatology.  Weakness of both lower extremities Uncertain of the etiology. Possibilities include myositis, complex regional pain syndrome, Pincus Badder syndrome from possible breast mass. She is getting this breast mass evaluated tomorrow and will let us know the results. She will drop off the copies of her x-rays and MRI from [facility] orthopedics. She will also have her rheumatologist fax over her lab results. We will look at this information and discuss with patient the best treatment plan. Started her on amitriptyline low doses at night to try to help with her significant pain. Anti-inflammatories and tramadol have not helped.

## 2016-04-26 NOTE — Assessment & Plan Note (Signed)
Uncertain of the etiology. Possibilities include myositis, complex regional pain syndrome, Pincus Badder syndrome from possible breast mass. She is getting this breast mass evaluated tomorrow and will let us know the results. She will drop off the copies of her x-rays and MRI from [facility] orthopedics. She will also have her rheumatologist fax over her lab results. We will look at this information and discuss with patient the best treatment plan. Started her on amitriptyline low doses at night to try to help with her significant pain. Anti-inflammatories and tramadol have not helped.

## 2016-04-26 NOTE — Assessment & Plan Note (Addendum)
Amitriptyline at night.  Will get MRI/X-rays from [facility].  Lab results from Rheumatology.

## 2016-04-27 ENCOUNTER — Ambulatory Visit
Admission: RE | Admit: 2016-04-27 | Discharge: 2016-04-27 | Disposition: A | Discharge status: Home / Self Care | Payer: Medicare Other | Source: Ambulatory Visit | Attending: Obstetrics | Admitting: Obstetrics

## 2016-04-27 DIAGNOSIS — R928 Other abnormal and inconclusive findings on diagnostic imaging of breast: Secondary | ICD-10-CM

## 2016-05-01 ENCOUNTER — Encounter: Payer: Self-pay | Admitting: Sports Medicine

## 2016-05-01 ENCOUNTER — Other Ambulatory Visit: Payer: Self-pay | Admitting: Physician Assistant

## 2016-05-01 DIAGNOSIS — M25551 Pain in right hip: Secondary | ICD-10-CM

## 2016-05-03 ENCOUNTER — Telehealth: Payer: Self-pay | Admitting: *Deleted

## 2016-05-03 NOTE — Telephone Encounter (Signed)
Receive PA for amitriptyline 25mg . Approved thru 02/04/17 02/06/17 Pharmacy notified

## 2016-05-10 ENCOUNTER — Ambulatory Visit
Admission: RE | Admit: 2016-05-10 | Discharge: 2016-05-10 | Disposition: A | Discharge status: Home / Self Care | Payer: Medicare Other | Source: Ambulatory Visit | Attending: Physician Assistant | Admitting: Physician Assistant

## 2016-05-10 DIAGNOSIS — M25551 Pain in right hip: Secondary | ICD-10-CM

## 2016-05-10 MED ORDER — GADOBENATE DIMEGLUMINE 529 MG/ML IV SOLN: 13.0000 mL | Freq: Once | INTRAVENOUS | Status: DC | PRN

## 2016-05-23 ENCOUNTER — Other Ambulatory Visit: Payer: Self-pay | Admitting: *Deleted

## 2016-05-23 MED ORDER — AMITRIPTYLINE HCL 25 MG PO TABS: 25.0000 mg | ORAL_TABLET | Freq: Every day | ORAL | 1 refills | Status: DC

## 2016-06-07 ENCOUNTER — Encounter: Payer: Self-pay | Admitting: Sports Medicine

## 2016-06-07 ENCOUNTER — Ambulatory Visit (INDEPENDENT_AMBULATORY_CARE_PROVIDER_SITE_OTHER): Payer: Medicare Other | Admitting: Sports Medicine

## 2016-06-07 DIAGNOSIS — M05751 Rheumatoid arthritis with rheumatoid factor of right hip without organ or systems involvement: Secondary | ICD-10-CM | POA: Diagnosis not present

## 2016-06-07 DIAGNOSIS — M05752 Rheumatoid arthritis with rheumatoid factor of left hip without organ or systems involvement: Secondary | ICD-10-CM | POA: Diagnosis not present

## 2016-06-07 DIAGNOSIS — M069 Rheumatoid arthritis, unspecified: Secondary | ICD-10-CM | POA: Insufficient documentation

## 2016-06-07 MED ORDER — AMITRIPTYLINE HCL 50 MG PO TABS: 50.0000 mg | ORAL_TABLET | Freq: Every day | ORAL | 1 refills | Status: DC

## 2016-06-07 NOTE — Assessment & Plan Note (Signed)
Continue follow-up with rheumatology. Will increase her amitriptyline to 50 mg.

## 2016-06-07 NOTE — Progress Notes (Signed)
  Brandi Hines - 66 y.o. female MRN [id number]  Date of birth: 04-28-1950  SUBJECTIVE:  Including CC & ROS.  CC: bilateral hip weakness  Presents in follow-up for bilateral hip pain weakness. At this point her left is worse than her right. She has noticed increased improvement. She has actually been diagnosed with rheumatoid arthritis. She is on prednisone and methotrexate prescribed by her rheumatologist. She had been started on amitriptyline which is helping with sleeping and pain. She is able to tolerate stairs much better now. Her weakness is also much improved.   ROS: No unexpected weight loss, fever, chills, swelling, instability, muscle pain, numbness/tingling, redness, otherwise see HPI   PMHx - Updated and reviewed.  Contributory factors include: Negative PSHx - Updated and reviewed.  Contributory factors include:  Negative FHx - Updated and reviewed.  Contributory factors include:  Negative Social Hx - Updated and reviewed. Contributory factors include: Negative Medications - reviewed   DATA REVIEWED: Rheumatology labs which were borderline for rheumatoid arthritis  PHYSICAL EXAM:  VS: BP:(!) 141/84  HR: bpm  TEMP: ( )  RESP:   HT:5\' 6"  (167.6 cm)   WT:135 lb (61.2 kg)  BMI:21.8 PHYSICAL EXAM: Gen: NAD, alert, cooperative with exam, well-appearing HEENT: clear conjunctiva,  CV:  no edema, capillary refill brisk, normal rate Resp: non-labored Skin: no rashes, normal turgor  Neuro: no gross deficits.  Psych:  alert and oriented  Hip: ROM IR: 45 Deg, ER: 45 Deg, Flexion: 120 Deg, Extension: 100 Deg, Abduction: 45 Deg, Adduction: 45 Deg Strength IR: 5/5, ER: 5/5, Flexion: 5/5, Extension: 5/5, Abduction: 5/5, Adduction: 5/5 Pelvic alignment unremarkable to inspection and palpation. Standing hip rotation and gait without trendelenburg sign / unsteadiness. Greater trochanter without tenderness to palpation. No tenderness over piriformis. No pain with FABER or FADIR. No  SI joint tenderness and normal minimal SI movement.   ASSESSMENT & PLAN:   Rheumatoid arthritis (HCC) Continue follow-up with rheumatology. Will increase her amitriptyline to 50 mg.

## 2016-08-10 ENCOUNTER — Other Ambulatory Visit: Payer: Self-pay | Admitting: *Deleted

## 2016-08-10 MED ORDER — AMITRIPTYLINE HCL 50 MG PO TABS: 50.0000 mg | ORAL_TABLET | Freq: Every day | ORAL | 1 refills | Status: DC

## 2016-10-30 ENCOUNTER — Other Ambulatory Visit: Payer: Self-pay

## 2016-10-30 MED ORDER — AMITRIPTYLINE HCL 50 MG PO TABS: 50.0000 mg | ORAL_TABLET | Freq: Every day | ORAL | 1 refills | Status: DC

## 2016-11-22 ENCOUNTER — Ambulatory Visit: Payer: Self-pay | Admitting: Orthopedic Surgery

## 2016-11-22 NOTE — H&P (Signed)
Brandi Hines DOB: 1950/04/27 Married / Language: English / Race: White Female Date of Admission:  12/12/2016 CC:  Left hip pain History of Present Illness The patient is a 66 year old female who comes in for a preoperative History and Physical. The patient is scheduled for a left total hip arthroplasty (anterior) to be performed by Dr. Gus Rankin. [name], MD at [facility] on 12-12-2016. The patient is a 66 year old female who presented with a hip problem. The patient reports left hip (worse than right) problems including pain symptoms that have been present for year(s). The symptoms began without any known injury. Symptoms reported include hip pain, stiffness, catching, difficulty flexing hip and difficulty ambulating (difficulty getting up from a seated position) The patient reports symptoms radiating to the: left groin and left knee.The patient feels as if their symptoms are does feel they are worsening. Symptoms are exacerbated by flexing hip, walking and sitting. Current treatment includes non-opioid analgesics (Tylenol Arthritis). Pertinent medical history includes rheumatoid arthritis. Prior to being seen, the patient was previously evaluated in this clinic. Previous workup for this problem has included hip x-rays. Previous treatment for this problem has included corticosteroid injection (01/05/16; did not provide any relief), nonsteroidal anti-inflammatory drugs and opioid analgesics. Brandi Hines is a very pleasant 66 year old female who was seen following transfer of care from Dr. Charlann Boxer. She comes in for hip evaluation, left greater than right hip pain. She states the hip pain been ongoing for about 3 years now with no known injury. It has been gradually getting worse and she was seen in about a year and a half ago for workup. She has had a couple hip injections which have only given temporary benefit. She has been recently diagnosed with rheumatoid arthritis and has been treated with  prednisone (which increased blood pressure) and also meloxicam (which cause swelling). The hip pain is been getting worse with time and she describes it more in the left groin and thigh. She has a little bit of discomfort in both groin areas when she is sitting but when she stands up and starts to walk the left hip will catch on her and cause pain. The first few steps are difficult but it does ease off after walking. They tend to hurt a little bit more though the longer that she is up on them. She denies any numbness or tingling with either leg but does have some pain in the thigh toward the knee coming down from the left groin. There is no pain below the knee and the lower leg. She will have some pain at night when she is turning over in bed.  Radiographs AP pelvis and lateral LEFT hip show that she is essentially bone-on-bone now with minimal joint space LEFT. She has subchondral cystic changes which have worsened. She has marginal osteophyte formation. She has significant pain and dysfunction in that LEFT hip. Although she is not fully bone-on-bone she has severe pain and is very close to bone-on-bone with cystic changes in the femoral head. She has had an intra-articular injection without a tremendous amount of lasting benefit. At this point, the most predictable means of improving pain and function is total hip arthroplasty. The procedure, risks, potential complications and rehab course are discussed in detail and the patient elects to proceed. They have been treated conservatively in the past for the above stated problem and despite conservative measures, they continue to have progressive pain and severe functional limitations and dysfunction. They have  failed non-operative management including home exercise, medications, and injections. It is felt that they would benefit from undergoing total joint replacement. Risks and benefits of the procedure have been discussed with the patient and they  elect to proceed with surgery. There are no active contraindications to surgery such as ongoing infection or rapidly progressive neurological disease.    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 age 15; Congestive Heart Failure Mother  Living. age 70  Social History Tobacco use  Never smoker. No alcohol use  No history of drug/alcohol rehab  Not under pain contract  Marital status  Married. Living situation  Lives with spouse. Current work Social worker. Current occupation  Engineer, agricultural Exercise  15 - 20 minutes, daily, Light. Walking Post-Surgical Plans  Home With Family, Home with HHPT. Futures trader.  Medication History  Butalbital-APAP-Caffeine (50-325-40MG  Tablet, Oral) Active. Estradiol (0.5MG  Tablet, Oral) Active. FLUoxetine HCl (40MG  Capsule, Oral) Active. Amitriptyline HCl (50MG  Tablet, Oral at bedtime) Active. Methotrexate Sodium (25MG /ML Solution, Injection once a week) Active. Ibuprofen (prn) Active. Folic Acid Active. Aller-tec Active. Digestive Probiotic Active.  Past Surgical History Appendectomy  Date: 58. Breast Biopsy  left Hysterectomy  Date: 16. partial (non-cancerous)   Review of Systems General Not Present- Chills, Fatigue, Fever, Memory Loss, Night Sweats, Weight Gain and Weight Loss. Skin Not Present- Eczema, Hives, Itching, Lesions and Rash. HEENT Not Present- Dentures, Double Vision, Headache, Hearing Loss, Tinnitus and Visual Loss. Respiratory Not Present- Allergies, Chronic Cough, Coughing up blood, Shortness of breath  at rest and Shortness of breath with exertion. Cardiovascular Not Present- Chest Pain, Difficulty Breathing Lying Down, Murmur, Palpitations, Racing/skipping heartbeats and Swelling. Gastrointestinal Not Present- Abdominal Pain, Bloody Stool, Constipation, Diarrhea, Difficulty Swallowing, Heartburn, Jaundice, Loss of appetitie, Nausea and Vomiting. Female Genitourinary Not Present- Blood in Urine, Discharge, Flank Pain, Incontinence, Painful Urination, Urgency, Urinary frequency, Urinary Retention, Urinating at Night and Weak urinary stream. Musculoskeletal Present- Morning Stiffness. Not Present- Back Pain, Joint Pain, Joint Swelling, Muscle Pain, Muscle Weakness and Spasms. Neurological Not Present- Blackout spells, Difficulty with balance, Dizziness, Paralysis, Tremor and Weakness. Psychiatric Not Present- Insomnia.  Vitals  Weight: 145 lb Height: 66in Body Surface Area: 1.74 m Body Mass Index: 23.4 kg/m  Pulse: 84 (Regular)  BP: 132/86 (Sitting, Right Arm, Standard)       Physical Exam  General Mental Status -Alert, cooperative and good historian. General Appearance-pleasant, Not in acute distress. Orientation-Oriented X3. Build & Nutrition-Well nourished and Well developed.  Head and Neck Head-normocephalic, atraumatic . Neck Global Assessment - supple, no bruit auscultated on the right, no bruit auscultated on the left.  Eye Vision-Wears contact lenses. Pupil - Bilateral-Regular and Round. Motion - Bilateral-EOMI.  Chest and Lung Exam Auscultation Breath sounds - clear at anterior chest wall and clear at posterior chest wall. Adventitious sounds - No Adventitious sounds.  Cardiovascular Auscultation Rhythm - Regular rate and rhythm. Heart Sounds - S1 WNL and S2 WNL. Murmurs & Other Heart Sounds - Auscultation of the heart reveals - No Murmurs.  Abdomen Palpation/Percussion Tenderness - Abdomen is non-tender to palpation. Rigidity  (guarding) - Abdomen is soft. Auscultation Auscultation of  the abdomen reveals - Bowel sounds normal.  Female Genitourinary Note: Not done, not pertinent to present illness   Musculoskeletal Note: Examination of the right hip shows flexion to 120 rotation in 30 abduction 40 and external rotation of 40. There is no tenderness over the greater trochanter. There is no pain on provocative testing of the hip. Her LEFT hip can be flexed to 100 with minimal internal rotation, approximately 10 of external rotation and 30 of abduction. There is pain on range of motion of that LEFT hip. She has slight trochanteric tenderness. She has a significant antalgic gait pattern on the LEFT.Right knee shows no effusion. Range of motion is 0-125. There is no crepitus on range of motion of the knee. There is no medial or lateral joint line tenderness. There is no instability noted. Left knee shows no effusion. Range of motion 0-125. No medial or lateral joint line tenderness. No instability. No crepitus on range of motion.  She has a significant antalgic gait pattern on the LEFT.  Radiographs AP pelvis and lateral LEFT hip show that she is essentially bone-on-bone now with minimal joint space LEFT. She has subchondral cystic changes which have worsened. She has marginal osteophyte formation.     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 Benedetto Goad - Patient has been seen preoperatively and felt to be stable for surgery.  IV TXA  Anesthesia Issues: Nausea following anesthesia  Patient was instructed on what medications to stop prior to surgery.  Signed electronically by Lauraine Rinne, III PA-C

## 2016-11-22 NOTE — H&P (View-Only) (Signed)
Brandi Hines DOB: 1950/04/27 Married / Language: English / Race: White Female Date of Admission:  12/12/2016 CC:  Left hip pain History of Present Illness The patient is a 66 year old female who comes in for a preoperative History and Physical. The patient is scheduled for a left total hip arthroplasty (anterior) to be performed by Dr. Gus Rankin. [name], MD at [facility] on 12-12-2016. The patient is a 66 year old female who presented with a hip problem. The patient reports left hip (worse than right) problems including pain symptoms that have been present for year(s). The symptoms began without any known injury. Symptoms reported include hip pain, stiffness, catching, difficulty flexing hip and difficulty ambulating (difficulty getting up from a seated position) The patient reports symptoms radiating to the: left groin and left knee.The patient feels as if their symptoms are does feel they are worsening. Symptoms are exacerbated by flexing hip, walking and sitting. Current treatment includes non-opioid analgesics (Tylenol Arthritis). Pertinent medical history includes rheumatoid arthritis. Prior to being seen, the patient was previously evaluated in this clinic. Previous workup for this problem has included hip x-rays. Previous treatment for this problem has included corticosteroid injection (01/05/16; did not provide any relief), nonsteroidal anti-inflammatory drugs and opioid analgesics. Brandi Hines is a very pleasant 66 year old female who was seen following transfer of care from Dr. Charlann Boxer. She comes in for hip evaluation, left greater than right hip pain. She states the hip pain been ongoing for about 3 years now with no known injury. It has been gradually getting worse and she was seen in about a year and a half ago for workup. She has had a couple hip injections which have only given temporary benefit. She has been recently diagnosed with rheumatoid arthritis and has been treated with  prednisone (which increased blood pressure) and also meloxicam (which cause swelling). The hip pain is been getting worse with time and she describes it more in the left groin and thigh. She has a little bit of discomfort in both groin areas when she is sitting but when she stands up and starts to walk the left hip will catch on her and cause pain. The first few steps are difficult but it does ease off after walking. They tend to hurt a little bit more though the longer that she is up on them. She denies any numbness or tingling with either leg but does have some pain in the thigh toward the knee coming down from the left groin. There is no pain below the knee and the lower leg. She will have some pain at night when she is turning over in bed.  Radiographs AP pelvis and lateral LEFT hip show that she is essentially bone-on-bone now with minimal joint space LEFT. She has subchondral cystic changes which have worsened. She has marginal osteophyte formation. She has significant pain and dysfunction in that LEFT hip. Although she is not fully bone-on-bone she has severe pain and is very close to bone-on-bone with cystic changes in the femoral head. She has had an intra-articular injection without a tremendous amount of lasting benefit. At this point, the most predictable means of improving pain and function is total hip arthroplasty. The procedure, risks, potential complications and rehab course are discussed in detail and the patient elects to proceed. They have been treated conservatively in the past for the above stated problem and despite conservative measures, they continue to have progressive pain and severe functional limitations and dysfunction. They have  failed non-operative management including home exercise, medications, and injections. It is felt that they would benefit from undergoing total joint replacement. Risks and benefits of the procedure have been discussed with the patient and they  elect to proceed with surgery. There are no active contraindications to surgery such as ongoing infection or rapidly progressive neurological disease.    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 age 84; Congestive Heart Failure Mother  Living. age 88  Social History Tobacco use  Never smoker. No alcohol use  No history of drug/alcohol rehab  Not under pain contract  Marital status  Married. Living situation  Lives with spouse. Current work status  Part-time. Current occupation  Piano Teacher Exercise  15 - 20 minutes, daily, Light. Walking Post-Surgical Plans  Home With Family, Home with HHPT. Advance Directives  Healthcare Power of Attorney.  Medication History  Butalbital-APAP-Caffeine (50-325-40MG Tablet, Oral) Active. Estradiol (0.5MG Tablet, Oral) Active. FLUoxetine HCl (40MG Capsule, Oral) Active. Amitriptyline HCl (50MG Tablet, Oral at bedtime) Active. Methotrexate Sodium (25MG/ML Solution, Injection once a week) Active. Ibuprofen (prn) Active. Folic Acid Active. Aller-tec Active. Digestive Probiotic Active.  Past Surgical History Appendectomy  Date: 1991. Breast Biopsy  left Hysterectomy  Date: 1990. partial (non-cancerous)   Review of Systems General Not Present- Chills, Fatigue, Fever, Memory Loss, Night Sweats, Weight Gain and Weight Loss. Skin Not Present- Eczema, Hives, Itching, Lesions and Rash. HEENT Not Present- Dentures, Double Vision, Headache, Hearing Loss, Tinnitus and Visual Loss. Respiratory Not Present- Allergies, Chronic Cough, Coughing up blood, Shortness of breath  at rest and Shortness of breath with exertion. Cardiovascular Not Present- Chest Pain, Difficulty Breathing Lying Down, Murmur, Palpitations, Racing/skipping heartbeats and Swelling. Gastrointestinal Not Present- Abdominal Pain, Bloody Stool, Constipation, Diarrhea, Difficulty Swallowing, Heartburn, Jaundice, Loss of appetitie, Nausea and Vomiting. Female Genitourinary Not Present- Blood in Urine, Discharge, Flank Pain, Incontinence, Painful Urination, Urgency, Urinary frequency, Urinary Retention, Urinating at Night and Weak urinary stream. Musculoskeletal Present- Morning Stiffness. Not Present- Back Pain, Joint Pain, Joint Swelling, Muscle Pain, Muscle Weakness and Spasms. Neurological Not Present- Blackout spells, Difficulty with balance, Dizziness, Paralysis, Tremor and Weakness. Psychiatric Not Present- Insomnia.  Vitals  Weight: 145 lb Height: 66in Body Surface Area: 1.74 m Body Mass Index: 23.4 kg/m  Pulse: 84 (Regular)  BP: 132/86 (Sitting, Right Arm, Standard)       Physical Exam  General Mental Status -Alert, cooperative and good historian. General Appearance-pleasant, Not in acute distress. Orientation-Oriented X3. Build & Nutrition-Well nourished and Well developed.  Head and Neck Head-normocephalic, atraumatic . Neck Global Assessment - supple, no bruit auscultated on the right, no bruit auscultated on the left.  Eye Vision-Wears contact lenses. Pupil - Bilateral-Regular and Round. Motion - Bilateral-EOMI.  Chest and Lung Exam Auscultation Breath sounds - clear at anterior chest wall and clear at posterior chest wall. Adventitious sounds - No Adventitious sounds.  Cardiovascular Auscultation Rhythm - Regular rate and rhythm. Heart Sounds - S1 WNL and S2 WNL. Murmurs & Other Heart Sounds - Auscultation of the heart reveals - No Murmurs.  Abdomen Palpation/Percussion Tenderness - Abdomen is non-tender to palpation. Rigidity  (guarding) - Abdomen is soft. Auscultation Auscultation of   the abdomen reveals - Bowel sounds normal.  Female Genitourinary Note: Not done, not pertinent to present illness   Musculoskeletal Note: Examination of the right hip shows flexion to 120 rotation in 30 abduction 40 and external rotation of 40. There is no tenderness over the greater trochanter. There is no pain on provocative testing of the hip. Her LEFT hip can be flexed to 100 with minimal internal rotation, approximately 10 of external rotation and 30 of abduction. There is pain on range of motion of that LEFT hip. She has slight trochanteric tenderness. She has a significant antalgic gait pattern on the LEFT.Right knee shows no effusion. Range of motion is 0-125. There is no crepitus on range of motion of the knee. There is no medial or lateral joint line tenderness. There is no instability noted. Left knee shows no effusion. Range of motion 0-125. No medial or lateral joint line tenderness. No instability. No crepitus on range of motion.  She has a significant antalgic gait pattern on the LEFT.  Radiographs AP pelvis and lateral LEFT hip show that she is essentially bone-on-bone now with minimal joint space LEFT. She has subchondral cystic changes which have worsened. She has marginal osteophyte formation.     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 Benedetto Goad - Patient has been seen preoperatively and felt to be stable for surgery.  IV TXA  Anesthesia Issues: Nausea following anesthesia  Patient was instructed on what medications to stop prior to surgery.  Signed electronically by Lauraine Rinne, III PA-C

## 2016-11-27 ENCOUNTER — Ambulatory Visit: Payer: Self-pay | Admitting: Orthopedic Surgery

## 2016-12-06 NOTE — Progress Notes (Signed)
10-03-16 Surgical clearance on chart

## 2016-12-06 NOTE — Patient Instructions (Addendum)
Brandi Hines  12/06/2016   Your procedure is scheduled on: 12-12-16   Report to [facility] Main  Entrance Report to Admitting at 9:30 AM   Call this number if you have problems the morning of surgery [phone]   Remember: ONLY 1 PERSON MAY GO WITH YOU TO SHORT STAY TO GET  READY MORNING OF YOUR SURGERY.  Do not eat food or drink liquids :After Midnight.     Take these medicines the morning of surgery with A SIP OF WATER: Cetirizine HCL, Fluoxetine (Prozac). You may also bring and use your nasal spray as needed.                                 You may not have any metal on your body including hair pins and              piercings  Do not wear jewelry, make-up, lotions, powders or perfumes, deodorant             Do not wear nail polish.  Do not shave  48 hours prior to surgery.               Do not bring valuables to the hospital. [place] IS NOT             RESPONSIBLE   FOR VALUABLES.  Contacts, dentures or bridgework may not be worn into surgery.  Leave suitcase in the car. After surgery it may be brought to your room.                 Please read over the following fact sheets you were given: _____________________________________________________________________             [facility] - Preparing for Surgery Before surgery, you can play an important role.  Because skin is not sterile, your skin needs to be as free of germs as possible.  You can reduce the number of germs on your skin by washing with CHG (chlorahexidine gluconate) soap before surgery.  CHG is an antiseptic cleaner which kills germs and bonds with the skin to continue killing germs even after washing. Please DO NOT use if you have an allergy to CHG or antibacterial soaps.  If your skin becomes reddened/irritated stop using the CHG and inform your nurse when you arrive at Short Stay. Do not shave (including legs and underarms) for at least 48 hours prior to the first CHG shower.  You  may shave your face/neck. Please follow these instructions carefully:  1.  Shower with CHG Soap the night before surgery and the  morning of Surgery.  2.  If you choose to wash your hair, wash your hair first as usual with your  normal  shampoo.  3.  After you shampoo, rinse your hair and body thoroughly to remove the  shampoo.                           4.  Use CHG as you would any other liquid soap.  You can apply chg directly  to the skin and wash                       Gently with a scrungie or clean washcloth.  5.  Apply the CHG  Soap to your body ONLY FROM THE NECK DOWN.   Do not use on face/ open                           Wound or open sores. Avoid contact with eyes, ears mouth and genitals (private parts).                       Wash face,  Genitals (private parts) with your normal soap.             6.  Wash thoroughly, paying special attention to the area where your surgery  will be performed.  7.  Thoroughly rinse your body with warm water from the neck down.  8.  DO NOT shower/wash with your normal soap after using and rinsing off  the CHG Soap.                9.  Pat yourself dry with a clean towel.            10.  Wear clean pajamas.            11.  Place clean sheets on your bed the night of your first shower and do not  sleep with pets. Day of Surgery : Do not apply any lotions/deodorants the morning of surgery.  Please wear clean clothes to the hospital/surgery center.  FAILURE TO FOLLOW THESE INSTRUCTIONS MAY RESULT IN THE CANCELLATION OF YOUR SURGERY PATIENT SIGNATURE_________________________________  NURSE SIGNATURE__________________________________  ________________________________________________________________________   [name]  An incentive spirometer is a tool that can help keep your lungs clear and active. This tool measures how well you are filling your lungs with each breath. Taking long deep breaths may help reverse or decrease the chance of  developing breathing (pulmonary) problems (especially infection) following:  A long period of time when you are unable to move or be active. BEFORE THE PROCEDURE   If the spirometer includes an indicator to show your best effort, your nurse or respiratory therapist will set it to a desired goal.  If possible, sit up straight or lean slightly forward. Try not to slouch.  Hold the incentive spirometer in an upright position. INSTRUCTIONS FOR USE  1. Sit on the edge of your bed if possible, or sit up as far as you can in bed or on a chair. 2. Hold the incentive spirometer in an upright position. 3. Breathe out normally. 4. Place the mouthpiece in your mouth and seal your lips tightly around it. 5. Breathe in slowly and as deeply as possible, raising the piston or the ball toward the top of the column. 6. Hold your breath for 3-5 seconds or for as long as possible. Allow the piston or ball to fall to the bottom of the column. 7. Remove the mouthpiece from your mouth and breathe out normally. 8. Rest for a few seconds and repeat Steps 1 through 7 at least 10 times every 1-2 hours when you are awake. Take your time and take a few normal breaths between deep breaths. 9. The spirometer may include an indicator to show your best effort. Use the indicator as a goal to work toward during each repetition. 10. After each set of 10 deep breaths, practice coughing to be sure your lungs are clear. If you have an incision (the cut made at the time of surgery), support your incision when coughing by placing a pillow or rolled up towels  firmly against it. Once you are able to get out of bed, walk around indoors and cough well. You may stop using the incentive spirometer when instructed by your caregiver.  RISKS AND COMPLICATIONS  Take your time so you do not get dizzy or light-headed.  If you are in pain, you may need to take or ask for pain medication before doing incentive spirometry. It is harder to take a  deep breath if you are having pain. AFTER USE  Rest and breathe slowly and easily.  It can be helpful to keep track of a log of your progress. Your caregiver can provide you with a simple table to help with this. If you are using the spirometer at home, follow these instructions: Many IF:   You are having difficultly using the spirometer.  You have trouble using the spirometer as often as instructed.  Your pain medication is not giving enough relief while using the spirometer.  You develop fever of 100.5 F (38.1 C) or higher. SEEK IMMEDIATE MEDICAL CARE IF:   You cough up bloody sputum that had not been present before.  You develop fever of 102 F (38.9 C) or greater.  You develop worsening pain at or near the incision site. MAKE SURE YOU:   Understand these instructions.  Will watch your condition.  Will get help right away if you are not doing well or get worse. Document Released: 06/04/2006 Document Revised: 04/16/2011 Document Reviewed: 08/05/2006 Exit[employer] Patient Information 2014 Exit[employer], [place].   ________________________________________________________________________  WHAT IS A BLOOD TRANSFUSION? Blood Transfusion Information  A transfusion is the replacement of blood or some of its parts. Blood is made up of multiple cells which provide different functions.  Red blood cells carry oxygen and are used for blood loss replacement.  White blood cells fight against infection.  Platelets control bleeding.  Plasma helps clot blood.  Other blood products are available for specialized needs, such as hemophilia or other clotting disorders. BEFORE THE TRANSFUSION  Who gives blood for transfusions?   Healthy volunteers who are fully evaluated to make sure their blood is safe. This is blood bank blood. Transfusion therapy is the safest it has ever been in the practice of medicine. Before blood is taken from a donor, a complete history is taken to make  sure that person has no history of diseases nor engages in risky social behavior (examples are intravenous drug use or sexual activity with multiple partners). The donor's travel history is screened to minimize risk of transmitting infections, such as malaria. The donated blood is tested for signs of infectious diseases, such as HIV and hepatitis. The blood is then tested to be sure it is compatible with you in order to minimize the chance of a transfusion reaction. If you or a relative donates blood, this is often done in anticipation of surgery and is not appropriate for emergency situations. It takes many days to process the donated blood. RISKS AND COMPLICATIONS Although transfusion therapy is very safe and saves many lives, the main dangers of transfusion include:   Getting an infectious disease.  Developing a transfusion reaction. This is an allergic reaction to something in the blood you were given. Every precaution is taken to prevent this. The decision to have a blood transfusion has been considered carefully by your caregiver before blood is given. Blood is not given unless the benefits outweigh the risks. AFTER THE TRANSFUSION  Right after receiving a blood transfusion, you will usually feel much better  and more energetic. This is especially true if your red blood cells have gotten low (anemic). The transfusion raises the level of the red blood cells which carry oxygen, and this usually causes an energy increase.  The nurse administering the transfusion will monitor you carefully for complications. HOME CARE INSTRUCTIONS  No special instructions are needed after a transfusion. You may find your energy is better. Speak with your caregiver about any limitations on activity for underlying diseases you may have. SEEK MEDICAL CARE IF:   Your condition is not improving after your transfusion.  You develop redness or irritation at the intravenous (IV) site. SEEK IMMEDIATE MEDICAL CARE IF:   Any of the following symptoms occur over the next 12 hours:  Shaking chills.  You have a temperature by mouth above 102 F (38.9 C), not controlled by medicine.  Chest, back, or muscle pain.  People around you feel you are not acting correctly or are confused.  Shortness of breath or difficulty breathing.  Dizziness and fainting.  You get a rash or develop hives.  You have a decrease in urine output.  Your urine turns a dark color or changes to pink, red, or brown. Any of the following symptoms occur over the next 10 days:  You have a temperature by mouth above 102 F (38.9 C), not controlled by medicine.  Shortness of breath.  Weakness after normal activity.  The white part of the eye turns yellow (jaundice).  You have a decrease in the amount of urine or are urinating less often.  Your urine turns a dark color or changes to pink, red, or brown. Document Released: 01/20/2000 Document Revised: 04/16/2011 Document Reviewed: 09/08/2007 [facility] Patient Information 2014 [place], [place].  _______________________________________________________________________

## 2016-12-07 ENCOUNTER — Other Ambulatory Visit (HOSPITAL_COMMUNITY): Payer: Medicare Other

## 2016-12-07 ENCOUNTER — Encounter (HOSPITAL_COMMUNITY): Payer: Self-pay

## 2016-12-07 ENCOUNTER — Encounter (HOSPITAL_COMMUNITY)
Admission: RE | Admit: 2016-12-07 | Discharge: 2016-12-07 | Disposition: A | Discharge status: Home / Self Care | Payer: Medicare Other | Source: Ambulatory Visit | Attending: Orthopedic Surgery | Admitting: Orthopedic Surgery

## 2016-12-07 DIAGNOSIS — M069 Rheumatoid arthritis, unspecified: Secondary | ICD-10-CM | POA: Diagnosis not present

## 2016-12-07 DIAGNOSIS — Z01818 Encounter for other preprocedural examination: Secondary | ICD-10-CM | POA: Insufficient documentation

## 2016-12-07 DIAGNOSIS — M1612 Unilateral primary osteoarthritis, left hip: Secondary | ICD-10-CM | POA: Diagnosis not present

## 2016-12-07 DIAGNOSIS — Z7901 Long term (current) use of anticoagulants: Secondary | ICD-10-CM | POA: Insufficient documentation

## 2016-12-07 DIAGNOSIS — Z79899 Other long term (current) drug therapy: Secondary | ICD-10-CM | POA: Insufficient documentation

## 2016-12-07 HISTORY — DX: Other complications of anesthesia, initial encounter: T88.59XA

## 2016-12-07 HISTORY — DX: Adverse effect of unspecified anesthetic, initial encounter: T41.45XA

## 2016-12-07 HISTORY — DX: Other specified postprocedural states: Z98.890

## 2016-12-07 HISTORY — DX: Nausea with vomiting, unspecified: R11.2

## 2016-12-07 LAB — APTT: aPTT: 30 seconds (ref 24–36)

## 2016-12-07 LAB — COMPREHENSIVE METABOLIC PANEL
ALK PHOS: 107 U/L (ref 38–126)
ALT: 35 U/L (ref 14–54)
AST: 35 U/L (ref 15–41)
Albumin: 3.6 g/dL (ref 3.5–5.0)
Anion gap: 7 (ref 5–15)
BILIRUBIN TOTAL: 0.6 mg/dL (ref 0.3–1.2)
BUN: 10 mg/dL (ref 6–20)
CO2: 27 mmol/L (ref 22–32)
Calcium: 9 mg/dL (ref 8.9–10.3)
Chloride: 105 mmol/L (ref 101–111)
Creatinine, Ser: 0.75 mg/dL (ref 0.44–1.00)
GFR calc Af Amer: >60 mL/min (ref >60)
GFR calc non Af Amer: >60 mL/min (ref >60)
GLUCOSE: 74 mg/dL (ref 65–99)
POTASSIUM: 3.7 mmol/L (ref 3.5–5.1)
Sodium: 139 mmol/L (ref 135–145)
TOTAL PROTEIN: 6.8 g/dL (ref 6.5–8.1)

## 2016-12-07 LAB — CBC
HEMATOCRIT: 40.6 % (ref 36.0–46.0)
Hemoglobin: 13.5 g/dL (ref 12.0–15.0)
MCH: 30.8 pg (ref 26.0–34.0)
MCHC: 33.3 g/dL (ref 30.0–36.0)
MCV: 92.7 fL (ref 78.0–100.0)
Platelets: 341 10*3/uL (ref 150–400)
RBC: 4.38 MIL/uL (ref 3.87–5.11)
RDW: 14.6 % (ref 11.5–15.5)
WBC: 6.2 10*3/uL (ref 4.0–10.5)

## 2016-12-07 LAB — PROTIME-INR
INR: 0.95
Prothrombin Time: 12.6 seconds (ref 11.4–15.2)

## 2016-12-07 LAB — SURGICAL PCR SCREEN
MRSA, PCR: NEGATIVE
STAPHYLOCOCCUS AUREUS: NEGATIVE

## 2016-12-07 LAB — ABO/RH: ABO/RH(D): A POS

## 2016-12-07 NOTE — Progress Notes (Signed)
Pt made aware that surgery time changed from 12:30 PM to 12:00. Pt to report to admitting at 9:30 AM

## 2016-12-12 ENCOUNTER — Inpatient Hospital Stay (HOSPITAL_COMMUNITY): Payer: Medicare Other | Admitting: Certified Registered Nurse Anesthetist

## 2016-12-12 ENCOUNTER — Encounter (HOSPITAL_COMMUNITY): Payer: Self-pay | Admitting: *Deleted

## 2016-12-12 ENCOUNTER — Inpatient Hospital Stay (HOSPITAL_COMMUNITY): Payer: Medicare Other

## 2016-12-12 ENCOUNTER — Inpatient Hospital Stay (HOSPITAL_COMMUNITY)
Admission: RE | Admit: 2016-12-12 | Discharge: 2016-12-13 | DRG: 470 | Disposition: A | Discharge status: Home Health | Payer: Medicare Other | Source: Ambulatory Visit | Attending: Orthopedic Surgery | Admitting: Orthopedic Surgery

## 2016-12-12 ENCOUNTER — Other Ambulatory Visit: Payer: Self-pay

## 2016-12-12 ENCOUNTER — Encounter (HOSPITAL_COMMUNITY)
Admission: RE | Disposition: A | Discharge status: Home Health | Payer: Self-pay | Source: Ambulatory Visit | Attending: Orthopedic Surgery

## 2016-12-12 DIAGNOSIS — Z973 Presence of spectacles and contact lenses: Secondary | ICD-10-CM | POA: Diagnosis not present

## 2016-12-12 DIAGNOSIS — Z79899 Other long term (current) drug therapy: Secondary | ICD-10-CM

## 2016-12-12 DIAGNOSIS — M25552 Pain in left hip: Secondary | ICD-10-CM | POA: Diagnosis present

## 2016-12-12 DIAGNOSIS — Z791 Long term (current) use of non-steroidal anti-inflammatories (NSAID): Secondary | ICD-10-CM | POA: Diagnosis not present

## 2016-12-12 DIAGNOSIS — M25752 Osteophyte, left hip: Secondary | ICD-10-CM | POA: Diagnosis present

## 2016-12-12 DIAGNOSIS — N200 Calculus of kidney: Secondary | ICD-10-CM | POA: Diagnosis present

## 2016-12-12 DIAGNOSIS — M069 Rheumatoid arthritis, unspecified: Secondary | ICD-10-CM | POA: Diagnosis present

## 2016-12-12 DIAGNOSIS — M169 Osteoarthritis of hip, unspecified: Secondary | ICD-10-CM | POA: Diagnosis present

## 2016-12-12 DIAGNOSIS — M160 Bilateral primary osteoarthritis of hip: Secondary | ICD-10-CM | POA: Diagnosis present

## 2016-12-12 DIAGNOSIS — Z90710 Acquired absence of both cervix and uterus: Secondary | ICD-10-CM | POA: Diagnosis not present

## 2016-12-12 DIAGNOSIS — Z96649 Presence of unspecified artificial hip joint: Secondary | ICD-10-CM

## 2016-12-12 HISTORY — PX: TOTAL HIP ARTHROPLASTY: SHX124

## 2016-12-12 LAB — TYPE AND SCREEN
ABO/RH(D): A POS
ANTIBODY SCREEN: NEGATIVE

## 2016-12-12 SURGERY — ARTHROPLASTY, HIP, TOTAL, ANTERIOR APPROACH
Anesthesia: Spinal | Site: Hip | Laterality: Left

## 2016-12-12 MED ORDER — DOCUSATE SODIUM 100 MG PO CAPS
100.0000 mg | ORAL_CAPSULE | Freq: Two times a day (BID) | ORAL | Status: DC
  Administered 2016-12-12 – 2016-12-13 (×2): 100 mg via ORAL
  Filled 2016-12-12 (×2): qty 1

## 2016-12-12 MED ORDER — LACTATED RINGERS IV SOLN
INTRAVENOUS | Status: DC
  Administered 2016-12-12: 10:00:00 via INTRAVENOUS

## 2016-12-12 MED ORDER — MIDAZOLAM HCL 2 MG/2ML IJ SOLN
INTRAMUSCULAR | Status: AC
  Filled 2016-12-12: qty 2

## 2016-12-12 MED ORDER — PROPOFOL 10 MG/ML IV BOLUS
INTRAVENOUS | Status: DC | PRN
  Administered 2016-12-12: 10 mg via INTRAVENOUS

## 2016-12-12 MED ORDER — MENTHOL 3 MG MT LOZG: 1.0000 | LOZENGE | OROMUCOSAL | Status: DC | PRN

## 2016-12-12 MED ORDER — FLEET ENEMA 7-19 GM/118ML RE ENEM: 1.0000 | ENEMA | Freq: Once | RECTAL | Status: DC | PRN

## 2016-12-12 MED ORDER — TRANEXAMIC ACID 1000 MG/10ML IV SOLN
1000.0000 mg | INTRAVENOUS | Status: AC
  Administered 2016-12-12: 1000 mg via INTRAVENOUS
  Filled 2016-12-12: qty 1100

## 2016-12-12 MED ORDER — BUPIVACAINE IN DEXTROSE 0.75-8.25 % IT SOLN
INTRATHECAL | Status: DC | PRN
  Administered 2016-12-12: 1.4 mL via INTRATHECAL

## 2016-12-12 MED ORDER — METHOCARBAMOL 500 MG PO TABS
500.0000 mg | ORAL_TABLET | Freq: Four times a day (QID) | ORAL | Status: DC | PRN
  Administered 2016-12-13 (×2): 500 mg via ORAL
  Filled 2016-12-12 (×3): qty 1

## 2016-12-12 MED ORDER — MORPHINE SULFATE (PF) 4 MG/ML IV SOLN
1.0000 mg | INTRAVENOUS | Status: DC | PRN
  Administered 2016-12-13: 1 mg via INTRAVENOUS
  Filled 2016-12-12: qty 1

## 2016-12-12 MED ORDER — 0.9 % SODIUM CHLORIDE (POUR BTL) OPTIME
TOPICAL | Status: DC | PRN
  Administered 2016-12-12: 1000 mL

## 2016-12-12 MED ORDER — ONDANSETRON HCL 4 MG/2ML IJ SOLN: 4.0000 mg | Freq: Once | INTRAMUSCULAR | Status: DC | PRN

## 2016-12-12 MED ORDER — ONDANSETRON HCL 4 MG PO TABS
4.0000 mg | ORAL_TABLET | Freq: Four times a day (QID) | ORAL | Status: DC | PRN
  Administered 2016-12-13 (×2): 4 mg via ORAL
  Filled 2016-12-12 (×2): qty 1

## 2016-12-12 MED ORDER — CEFAZOLIN SODIUM-DEXTROSE 2-4 GM/100ML-% IV SOLN
INTRAVENOUS | Status: AC
  Filled 2016-12-12: qty 100

## 2016-12-12 MED ORDER — DEXAMETHASONE SODIUM PHOSPHATE 10 MG/ML IJ SOLN: 10.0000 mg | Freq: Once | INTRAMUSCULAR | Status: DC

## 2016-12-12 MED ORDER — STERILE WATER FOR IRRIGATION IR SOLN
Status: DC | PRN
  Administered 2016-12-12: 2000 mL

## 2016-12-12 MED ORDER — TRANEXAMIC ACID 1000 MG/10ML IV SOLN
1000.0000 mg | Freq: Once | INTRAVENOUS | Status: AC
  Administered 2016-12-12: 1000 mg via INTRAVENOUS
  Filled 2016-12-12: qty 1100

## 2016-12-12 MED ORDER — ACETAMINOPHEN 10 MG/ML IV SOLN: 1000.0000 mg | Freq: Once | INTRAVENOUS | Status: DC

## 2016-12-12 MED ORDER — CEFAZOLIN SODIUM-DEXTROSE 1-4 GM/50ML-% IV SOLN
1.0000 g | Freq: Four times a day (QID) | INTRAVENOUS | Status: AC
  Administered 2016-12-12 (×2): 1 g via INTRAVENOUS
  Filled 2016-12-12 (×2): qty 50

## 2016-12-12 MED ORDER — RIVAROXABAN 10 MG PO TABS
10.0000 mg | ORAL_TABLET | Freq: Every day | ORAL | Status: DC
  Administered 2016-12-13: 10 mg via ORAL
  Filled 2016-12-12: qty 1

## 2016-12-12 MED ORDER — PROPOFOL 10 MG/ML IV BOLUS
INTRAVENOUS | Status: AC
  Filled 2016-12-12: qty 40

## 2016-12-12 MED ORDER — POLYETHYLENE GLYCOL 3350 17 G PO PACK
17.0000 g | PACK | Freq: Every day | ORAL | Status: DC | PRN
Start: 2016-12-12 — End: 2016-12-13

## 2016-12-12 MED ORDER — FLUTICASONE PROPIONATE 50 MCG/ACT NA SUSP
1.0000 | Freq: Every day | NASAL | Status: DC
  Filled 2016-12-12: qty 16

## 2016-12-12 MED ORDER — CEFAZOLIN SODIUM-DEXTROSE 2-4 GM/100ML-% IV SOLN: 2.0000 g | INTRAVENOUS | Status: DC

## 2016-12-12 MED ORDER — PROPOFOL 500 MG/50ML IV EMUL
INTRAVENOUS | Status: DC | PRN
  Administered 2016-12-12: 75 ug/kg/min via INTRAVENOUS

## 2016-12-12 MED ORDER — FENTANYL CITRATE (PF) 100 MCG/2ML IJ SOLN
INTRAMUSCULAR | Status: AC
  Filled 2016-12-12: qty 2

## 2016-12-12 MED ORDER — HYDROCODONE-ACETAMINOPHEN 5-325 MG PO TABS
2.0000 | ORAL_TABLET | ORAL | Status: DC | PRN
  Administered 2016-12-13 (×3): 2 via ORAL
  Filled 2016-12-12 (×3): qty 2

## 2016-12-12 MED ORDER — PHENYLEPHRINE 40 MCG/ML (10ML) SYRINGE FOR IV PUSH (FOR BLOOD PRESSURE SUPPORT)
PREFILLED_SYRINGE | INTRAVENOUS | Status: AC
  Filled 2016-12-12: qty 10

## 2016-12-12 MED ORDER — ACETAMINOPHEN 10 MG/ML IV SOLN
1000.0000 mg | Freq: Once | INTRAVENOUS | Status: AC
  Administered 2016-12-12: 1000 mg via INTRAVENOUS

## 2016-12-12 MED ORDER — CHLORHEXIDINE GLUCONATE 4 % EX LIQD: 60.0000 mL | Freq: Once | CUTANEOUS | Status: DC

## 2016-12-12 MED ORDER — ACETAMINOPHEN 10 MG/ML IV SOLN
INTRAVENOUS | Status: AC
  Filled 2016-12-12: qty 100

## 2016-12-12 MED ORDER — METOCLOPRAMIDE HCL 5 MG PO TABS: 5.0000 mg | ORAL_TABLET | Freq: Three times a day (TID) | ORAL | Status: DC | PRN

## 2016-12-12 MED ORDER — EPHEDRINE 5 MG/ML INJ
INTRAVENOUS | Status: AC
  Filled 2016-12-12: qty 10

## 2016-12-12 MED ORDER — ACETAMINOPHEN 650 MG RE SUPP: 650.0000 mg | RECTAL | Status: DC | PRN

## 2016-12-12 MED ORDER — FENTANYL CITRATE (PF) 100 MCG/2ML IJ SOLN
25.0000 ug | INTRAMUSCULAR | Status: DC | PRN
  Administered 2016-12-12 (×3): 25 ug via INTRAVENOUS

## 2016-12-12 MED ORDER — LACTATED RINGERS IV SOLN
INTRAVENOUS | Status: DC
  Administered 2016-12-12 (×2): via INTRAVENOUS

## 2016-12-12 MED ORDER — AMITRIPTYLINE HCL 50 MG PO TABS
50.0000 mg | ORAL_TABLET | Freq: Every day | ORAL | Status: DC
  Administered 2016-12-12: 50 mg via ORAL
  Filled 2016-12-12: qty 1

## 2016-12-12 MED ORDER — ACETAMINOPHEN 500 MG PO TABS
1000.0000 mg | ORAL_TABLET | Freq: Four times a day (QID) | ORAL | Status: DC
  Administered 2016-12-12 – 2016-12-13 (×2): 1000 mg via ORAL
  Filled 2016-12-12 (×2): qty 2

## 2016-12-12 MED ORDER — METHOCARBAMOL 1000 MG/10ML IJ SOLN
500.0000 mg | Freq: Four times a day (QID) | INTRAMUSCULAR | Status: DC | PRN
  Administered 2016-12-12: 500 mg via INTRAVENOUS
  Filled 2016-12-12: qty 550

## 2016-12-12 MED ORDER — MIDAZOLAM HCL 5 MG/5ML IJ SOLN
INTRAMUSCULAR | Status: DC | PRN
  Administered 2016-12-12: 2 mg via INTRAVENOUS

## 2016-12-12 MED ORDER — PHENYLEPHRINE HCL 10 MG/ML IJ SOLN
INTRAMUSCULAR | Status: AC
  Filled 2016-12-12: qty 1

## 2016-12-12 MED ORDER — FLUOXETINE HCL 20 MG PO CAPS
40.0000 mg | ORAL_CAPSULE | Freq: Every day | ORAL | Status: DC
  Administered 2016-12-13: 10:00:00 40 mg via ORAL
  Filled 2016-12-12: qty 2

## 2016-12-12 MED ORDER — BUPIVACAINE HCL (PF) 0.25 % IJ SOLN
INTRAMUSCULAR | Status: DC | PRN
Start: 2016-12-12 — End: 2016-12-12
  Administered 2016-12-12: 30 mL

## 2016-12-12 MED ORDER — BISACODYL 10 MG RE SUPP: 10.0000 mg | Freq: Every day | RECTAL | Status: DC | PRN

## 2016-12-12 MED ORDER — BUTALBITAL-APAP-CAFFEINE 50-325-40 MG PO TABS: 1.0000 | ORAL_TABLET | ORAL | Status: DC | PRN

## 2016-12-12 MED ORDER — ACETAMINOPHEN 325 MG PO TABS: 650.0000 mg | ORAL_TABLET | ORAL | Status: DC | PRN

## 2016-12-12 MED ORDER — FENTANYL CITRATE (PF) 100 MCG/2ML IJ SOLN
INTRAMUSCULAR | Status: DC | PRN
  Administered 2016-12-12: 100 ug via INTRAVENOUS

## 2016-12-12 MED ORDER — EPHEDRINE SULFATE 50 MG/ML IJ SOLN
INTRAMUSCULAR | Status: DC | PRN
  Administered 2016-12-12 (×2): 10 mg via INTRAVENOUS

## 2016-12-12 MED ORDER — SODIUM CHLORIDE 0.9 % IV SOLN
INTRAVENOUS | Status: DC | PRN
  Administered 2016-12-12: 25 ug/min via INTRAVENOUS

## 2016-12-12 MED ORDER — PHENOL 1.4 % MT LIQD
1.0000 | OROMUCOSAL | Status: DC | PRN
  Filled 2016-12-12: qty 177

## 2016-12-12 MED ORDER — CEFAZOLIN SODIUM-DEXTROSE 2-4 GM/100ML-% IV SOLN
2.0000 g | INTRAVENOUS | Status: AC
  Administered 2016-12-12: 2 g via INTRAVENOUS

## 2016-12-12 MED ORDER — DIPHENHYDRAMINE HCL 12.5 MG/5ML PO ELIX: 12.5000 mg | ORAL_SOLUTION | ORAL | Status: DC | PRN

## 2016-12-12 MED ORDER — ONDANSETRON HCL 4 MG/2ML IJ SOLN: 4.0000 mg | Freq: Four times a day (QID) | INTRAMUSCULAR | Status: DC | PRN

## 2016-12-12 MED ORDER — OXYCODONE HCL 5 MG PO TABS
5.0000 mg | ORAL_TABLET | ORAL | Status: DC | PRN
  Administered 2016-12-12 – 2016-12-13 (×3): 5 mg via ORAL
  Filled 2016-12-12 (×4): qty 1

## 2016-12-12 MED ORDER — PHENYLEPHRINE HCL 10 MG/ML IJ SOLN
INTRAMUSCULAR | Status: DC | PRN
  Administered 2016-12-12: 80 ug via INTRAVENOUS

## 2016-12-12 MED ORDER — ONDANSETRON HCL 4 MG/2ML IJ SOLN
INTRAMUSCULAR | Status: DC | PRN
  Administered 2016-12-12: 4 mg via INTRAVENOUS

## 2016-12-12 MED ORDER — ONDANSETRON HCL 4 MG/2ML IJ SOLN
INTRAMUSCULAR | Status: AC
  Filled 2016-12-12: qty 2

## 2016-12-12 MED ORDER — SODIUM CHLORIDE 0.9 % IV SOLN
INTRAVENOUS | Status: DC
  Administered 2016-12-12: 16:00:00 via INTRAVENOUS

## 2016-12-12 MED ORDER — METOCLOPRAMIDE HCL 5 MG/ML IJ SOLN: 5.0000 mg | Freq: Three times a day (TID) | INTRAMUSCULAR | Status: DC | PRN

## 2016-12-12 MED ORDER — BUPIVACAINE HCL (PF) 0.25 % IJ SOLN
INTRAMUSCULAR | Status: AC
  Filled 2016-12-12: qty 30

## 2016-12-12 MED ORDER — DEXAMETHASONE SODIUM PHOSPHATE 10 MG/ML IJ SOLN
10.0000 mg | Freq: Once | INTRAMUSCULAR | Status: AC
  Administered 2016-12-12: 10 mg via INTRAVENOUS

## 2016-12-12 MED ORDER — DEXAMETHASONE SODIUM PHOSPHATE 10 MG/ML IJ SOLN
10.0000 mg | Freq: Once | INTRAMUSCULAR | Status: AC
  Administered 2016-12-13: 10:00:00 10 mg via INTRAVENOUS
  Filled 2016-12-12: qty 1

## 2016-12-12 SURGICAL SUPPLY — 41 items
BAG ZIPLOCK 12X15 (MISCELLANEOUS) ×3 IMPLANT
BLADE SAG 18X100X1.27 (BLADE) ×3 IMPLANT
CAPT HIP TOTAL 2 ×3 IMPLANT
CLOSURE WOUND 1/2 X4 (GAUZE/BANDAGES/DRESSINGS) ×2
CLOTH BEACON ORANGE TIMEOUT ST (SAFETY) ×3 IMPLANT
COVER PERINEAL POST (MISCELLANEOUS) ×3 IMPLANT
COVER SURGICAL LIGHT HANDLE (MISCELLANEOUS) ×3 IMPLANT
DECANTER SPIKE VIAL GLASS SM (MISCELLANEOUS) ×3 IMPLANT
DRAPE STERI IOBAN 125X83 (DRAPES) ×3 IMPLANT
DRAPE U-SHAPE 47X51 STRL (DRAPES) ×6 IMPLANT
DRSG ADAPTIC 3X8 NADH LF (GAUZE/BANDAGES/DRESSINGS) ×3 IMPLANT
DRSG MEPILEX BORDER 4X4 (GAUZE/BANDAGES/DRESSINGS) ×3 IMPLANT
DRSG MEPILEX BORDER 4X8 (GAUZE/BANDAGES/DRESSINGS) ×3 IMPLANT
DURAPREP 26ML APPLICATOR (WOUND CARE) ×3 IMPLANT
ELECT REM PT RETURN 15FT ADLT (MISCELLANEOUS) ×3 IMPLANT
EVACUATOR 1/8 PVC DRAIN (DRAIN) ×3 IMPLANT
GLOVE BIO SURGEON STRL SZ 6.5 (GLOVE) ×2 IMPLANT
GLOVE BIO SURGEON STRL SZ7.5 (GLOVE) ×3 IMPLANT
GLOVE BIO SURGEON STRL SZ8 (GLOVE) ×6 IMPLANT
GLOVE BIO SURGEONS STRL SZ 6.5 (GLOVE) ×1
GLOVE BIOGEL PI IND STRL 6.5 (GLOVE) ×1 IMPLANT
GLOVE BIOGEL PI IND STRL 7.0 (GLOVE) ×4 IMPLANT
GLOVE BIOGEL PI IND STRL 7.5 (GLOVE) ×1 IMPLANT
GLOVE BIOGEL PI IND STRL 8 (GLOVE) ×2 IMPLANT
GLOVE BIOGEL PI INDICATOR 6.5 (GLOVE) ×2
GLOVE BIOGEL PI INDICATOR 7.0 (GLOVE) ×8
GLOVE BIOGEL PI INDICATOR 7.5 (GLOVE) ×2
GLOVE BIOGEL PI INDICATOR 8 (GLOVE) ×4
GOWN STRL REUS W/TWL LRG LVL3 (GOWN DISPOSABLE) ×6 IMPLANT
GOWN STRL REUS W/TWL XL LVL3 (GOWN DISPOSABLE) ×9 IMPLANT
PACK ANTERIOR HIP CUSTOM (KITS) ×3 IMPLANT
STRIP CLOSURE SKIN 1/2X4 (GAUZE/BANDAGES/DRESSINGS) ×4 IMPLANT
SUT ETHIBOND NAB CT1 #1 30IN (SUTURE) ×3 IMPLANT
SUT MNCRL AB 4-0 PS2 18 (SUTURE) ×3 IMPLANT
SUT STRATAFIX 0 PDS 27 VIOLET (SUTURE) ×3
SUT VIC AB 2-0 CT1 27 (SUTURE) ×4
SUT VIC AB 2-0 CT1 TAPERPNT 27 (SUTURE) ×2 IMPLANT
SUTURE STRATFX 0 PDS 27 VIOLET (SUTURE) ×1 IMPLANT
SYR 50ML LL SCALE MARK (SYRINGE) IMPLANT
TRAY FOLEY CATH SILVER 14FR (SET/KITS/TRAYS/PACK) ×3 IMPLANT
YANKAUER SUCT BULB TIP 10FT TU (MISCELLANEOUS) ×3 IMPLANT

## 2016-12-12 NOTE — Anesthesia Postprocedure Evaluation (Signed)
Anesthesia Post Note  Patient: Brandi Hines  Procedure(s) Performed: LEFT TOTAL HIP ARTHROPLASTY ANTERIOR APPROACH (Left Hip)     Patient location during evaluation: PACU Anesthesia Type: Spinal Level of consciousness: awake and alert Pain management: pain level controlled Vital Signs Assessment: post-procedure vital signs reviewed and stable Respiratory status: spontaneous breathing and respiratory function stable Cardiovascular status: blood pressure returned to baseline and stable Postop Assessment: spinal receding and no apparent nausea or vomiting Anesthetic complications: no    Last Vitals:  Vitals:   12/12/16 1355 12/12/16 1400  BP:  128/77  Pulse: 72 72  Resp: 12 12  Temp:    SpO2: 100% 98%    Last Pain:  Vitals:   12/12/16 1415  TempSrc:   PainSc: Asleep                 Beryle Lathe

## 2016-12-12 NOTE — Anesthesia Preprocedure Evaluation (Addendum)
Anesthesia Evaluation  Patient identified by MRN, date of birth, ID band Patient awake    Reviewed: Allergy & Precautions, NPO status , Patient's Chart, lab work & pertinent test results  History of Anesthesia Complications (+) PONV  Airway Mallampati: II  TM Distance: >3 FB Neck ROM: Full    Dental no notable dental hx. (+) Dental Advisory Given, Teeth Intact   Pulmonary neg pulmonary ROS,    Pulmonary exam normal breath sounds clear to auscultation       Cardiovascular negative cardio ROS Normal cardiovascular exam Rhythm:Regular Rate:Normal     Neuro/Psych negative neurological ROS  negative psych ROS   GI/Hepatic negative GI ROS, Neg liver ROS,   Endo/Other  negative endocrine ROS  Renal/GU Nephrolithiasis  negative genitourinary   Musculoskeletal negative musculoskeletal ROS (+) Arthritis , Osteoarthritis and Rheumatoid disorders,    Abdominal   Peds  Hematology negative hematology ROS (+)   Anesthesia Other Findings   Reproductive/Obstetrics                            Anesthesia Physical Anesthesia Plan  ASA: II  Anesthesia Plan: Spinal   Post-op Pain Management:    Induction:   PONV Risk Score and Plan: 3 and Treatment may vary due to age or medical condition and Propofol infusion  Airway Management Planned: Natural Airway and Nasal Cannula  Additional Equipment: None  Intra-op Plan:   Post-operative Plan:   Informed Consent: I have reviewed the patients History and Physical, chart, labs and discussed the procedure including the risks, benefits and alternatives for the proposed anesthesia with the patient or authorized representative who has indicated his/her understanding and acceptance.   Dental advisory given  Plan Discussed with: CRNA  Anesthesia Plan Comments:         Anesthesia Quick Evaluation

## 2016-12-12 NOTE — Progress Notes (Signed)
Care assumed for this patient at this time. Pt is alert, calm and talkative and has no s/s of distress. VS and orders assessed and will continue to monitor and tx pt according to MD orders. Report received from [place], [place] at pt bedside.

## 2016-12-12 NOTE — Plan of Care (Signed)
Education done 

## 2016-12-12 NOTE — Evaluation (Signed)
Physical Therapy Evaluation Patient Details Name: Brandi Hines MRN: [id number] DOB: 27-Mar-1950 Today's Date: 12/12/2016   History of Present Illness  Pt s/p L THR  Clinical Impression  Pt s/p L THR and presents with decreased L LE strength/ROM and post op pain limiting functional mobility.  Pt should progress to dc home with family assist.    Follow Up Recommendations Home health PT;DC plan and follow up therapy as arranged by surgeon    Equipment Recommendations  Rolling walker with 5" wheels    Recommendations for Other Services       Precautions / Restrictions Precautions Precautions: Fall Restrictions Weight Bearing Restrictions: No Other Position/Activity Restrictions: WBAT      Mobility  Bed Mobility Overal bed mobility: Needs Assistance Bed Mobility: Supine to Sit     Supine to sit: Min assist     General bed mobility comments: cues for sequence and use of R LE to self assist  Transfers Overall transfer level: Needs assistance Equipment used: Rolling walker (2 wheeled) Transfers: Sit to/from Stand Sit to Stand: Min assist         General transfer comment: cues for LE management and use of UEs to self assist  Ambulation/Gait Ambulation/Gait assistance: Min assist Ambulation Distance (Feet): 38 Feet Assistive device: Rolling walker (2 wheeled) Gait Pattern/deviations: Step-to pattern;Decreased step length - right;Decreased step length - left;Shuffle;Trunk flexed Gait velocity: decr Gait velocity interpretation: Below normal speed for age/gender General Gait Details: cues for sequence, posture and position from RW.  Distance ltd by onset of dizziness  Stairs            Wheelchair Mobility    Modified Rankin (Stroke Patients Only)       Balance                                             Pertinent Vitals/Pain Pain Assessment: 0-10 Pain Score: 3  Pain Location: L hip Pain Descriptors / Indicators: Aching;Sore Pain  Intervention(s): Limited activity within patient's tolerance;Monitored during session;Premedicated before session;Ice applied    Home Living Family/patient expects to be discharged to:: Private residence Living Arrangements: Spouse/significant other Available Help at Discharge: Family Type of Home: House Home Access: Stairs to enter   Secretary/administrator of Steps: 1 Home Layout: One level Home Equipment: Shower seat - built in      Prior Function Level of Independence: Independent               Higher education careers adviser        Extremity/Trunk Assessment   Upper Extremity Assessment Upper Extremity Assessment: Overall WFL for tasks assessed    Lower Extremity Assessment Lower Extremity Assessment: LLE deficits/detail    Cervical / Trunk Assessment Cervical / Trunk Assessment: Normal  Communication   Communication: No difficulties  Cognition Arousal/Alertness: Awake/alert Behavior During Therapy: WFL for tasks assessed/performed Overall Cognitive Status: Within Functional Limits for tasks assessed                                        General Comments      Exercises Total Joint Exercises Ankle Circles/Pumps: AROM;Both;15 reps;Supine   Assessment/Plan    PT Assessment Patient needs continued PT services  PT Problem List Decreased strength;Decreased range of motion;Decreased activity tolerance;Decreased mobility;Decreased knowledge of  use of DME;Pain       PT Treatment Interventions DME instruction;Gait training;Functional mobility training;Stair training;Therapeutic activities;Therapeutic exercise;Patient/family education    PT Goals (Current goals can be found in the Care Plan section)  Acute Rehab PT Goals Patient Stated Goal: Regain IND PT Goal Formulation: With patient Time For Goal Achievement: 12/15/16 Potential to Achieve Goals: Good    Frequency 7X/week   Barriers to discharge        Co-evaluation               AM-PAC  PT "6 Clicks" Daily Activity  Outcome Measure Difficulty turning over in bed (including adjusting bedclothes, sheets and blankets)?: Unable Difficulty moving from lying on back to sitting on the side of the bed? : Unable Difficulty sitting down on and standing up from a chair with arms (e.g., wheelchair, bedside commode, etc,.)?: Unable Help needed moving to and from a bed to chair (including a wheelchair)?: A Little Help needed walking in hospital room?: A Little Help needed climbing 3-5 steps with a railing? : A Lot 6 Click Score: 11    End of Session Equipment Utilized During Treatment: Gait belt Activity Tolerance: Patient tolerated treatment well;Other (comment)(dizziness) Patient left: in chair Nurse Communication: Mobility status;Other (comment)(dizziness) PT Visit Diagnosis: Difficulty in walking, not elsewhere classified (R26.2)    Time: 7124-5809 PT Time Calculation (min) (ACUTE ONLY): 21 min   Charges:   PT Evaluation $PT Eval Low Complexity: 1 Low     PT G Codes:        Pg [phone]   Colon Rueth 12/12/2016, 6:19 PM

## 2016-12-12 NOTE — Transfer of Care (Signed)
Immediate Anesthesia Transfer of Care Note  Patient: Brandi Hines  Procedure(s) Performed: LEFT TOTAL HIP ARTHROPLASTY ANTERIOR APPROACH (Left Hip)  Patient Location: PACU  Anesthesia Type:Spinal  Level of Consciousness: awake, alert  and oriented  Airway & Oxygen Therapy: Patient Spontanous Breathing and Patient connected to face mask oxygen  Post-op Assessment: Report given to RN and Post -op Vital signs reviewed and stable  Post vital signs: Reviewed and stable  Last Vitals:  Vitals:   12/12/16 0937  BP: 136/70  Pulse: 80  Resp: 16  Temp: 37.1 C  SpO2: 98%    Last Pain:  Vitals:   12/12/16 0937  TempSrc: Oral         Complications: No apparent anesthesia complications

## 2016-12-12 NOTE — Interval H&P Note (Signed)
History and Physical Interval Note:  12/12/2016 9:49 AM  Brandi Hines  has presented today for surgery, with the diagnosis of Osteoarthritis Left hip  The various methods of treatment have been discussed with the patient and family. After consideration of risks, benefits and other options for treatment, the patient has consented to  Procedure(s): LEFT TOTAL HIP ARTHROPLASTY ANTERIOR APPROACH (Left) as a surgical intervention .  The patient's history has been reviewed, patient examined, no change in status, stable for surgery.  I have reviewed the patient's chart and labs.  Questions were answered to the patient's satisfaction.     Loanne Drilling

## 2016-12-12 NOTE — Anesthesia Procedure Notes (Signed)
Spinal  Patient location during procedure: OR End time: 12/12/2016 11:04 AM Staffing Resident/CRNA: Lissa Morales, CRNA Performed: anesthesiologist  Preanesthetic Checklist Completed: patient identified, site marked, surgical consent, pre-op evaluation, timeout performed, IV checked, risks and benefits discussed and monitors and equipment checked Spinal Block Patient position: sitting Prep: DuraPrep Patient monitoring: continuous pulse ox and blood pressure Approach: midline Location: L3-4 Injection technique: single-shot Needle Needle type: Pencan  Needle gauge: 24 G Needle length: 9 cm Additional Notes Expiration date of kit checked and confirmed. Patient tolerated procedure well, without complications.

## 2016-12-12 NOTE — Op Note (Signed)
OPERATIVE REPORT- TOTAL HIP ARTHROPLASTY   PREOPERATIVE DIAGNOSIS: Osteoarthritis of the Left hip.   POSTOPERATIVE DIAGNOSIS: Osteoarthritis of the Left  hip.   PROCEDURE: Left total hip arthroplasty, anterior approach.   SURGEON: Ollen Gross, MD   ASSISTANT: Avel Peace, PA-C  ANESTHESIA:  Spinal  ESTIMATED BLOOD LOSS:-400 mL    DRAINS: Hemovac x1.   COMPLICATIONS: None   CONDITION: PACU - hemodynamically stable.   BRIEF CLINICAL NOTE: Brandi Hines is a 66 y.o. female who has advanced end-  stage arthritis of their Left  hip with progressively worsening pain and  dysfunction.The patient has failed nonoperative management and presents for  total hip arthroplasty.   PROCEDURE IN DETAIL: After successful administration of spinal  anesthetic, the traction boots for the [facility] bed were placed on both  feet and the patient was placed onto the [facility] bed, boots placed into the leg  holders. The Left hip was then isolated from the perineum with plastic  drapes and prepped and draped in the usual sterile fashion. ASIS and  greater trochanter were marked and a oblique incision was made, starting  at about 1 cm lateral and 2 cm distal to the ASIS and coursing towards  the anterior cortex of the femur. The skin was cut with a 10 blade  through subcutaneous tissue to the level of the fascia overlying the  tensor fascia lata muscle. The fascia was then incised in line with the  incision at the junction of the anterior third and posterior 2/3rd. The  muscle was teased off the fascia and then the interval between the TFL  and the rectus was developed. The Hohmann retractor was then placed at  the top of the femoral neck over the capsule. The vessels overlying the  capsule were cauterized and the fat on top of the capsule was removed.  A Hohmann retractor was then placed anterior underneath the rectus  femoris to give exposure to the entire anterior capsule. A T-shaped   capsulotomy was performed. The edges were tagged and the femoral head  was identified.       Osteophytes are removed off the superior acetabulum.  The femoral neck was then cut in situ with an oscillating saw. Traction  was then applied to the left lower extremity utilizing the [facility]  traction. The femoral head was then removed. Retractors were placed  around the acetabulum and then circumferential removal of the labrum was  performed. Osteophytes were also removed. Reaming starts at 45 mm to  medialize and  Increased in 2 mm increments to 47 mm. We reamed in  approximately 40 degrees of abduction, 20 degrees anteversion. A 48 mm  pinnacle acetabular shell was then impacted in anatomic position under  fluoroscopic guidance with excellent purchase. We did not need to place  any additional dome screws. A 28 mm neutral + 4 marathon liner was then  placed into the acetabular shell.       The femoral lift was then placed along the lateral aspect of the femur  just distal to the vastus ridge. The leg was  externally rotated and capsule  was stripped off the inferior aspect of the femoral neck down to the  level of the lesser trochanter, this was done with electrocautery. The femur was lifted after this was performed. The  leg was then placed in an extended and adducted position essentially delivering the femur. We also removed the capsule superiorly and the piriformis from the piriformis  fossa to gain excellent exposure of the  proximal femur. Rongeur was used to remove some cancellous bone to get  into the lateral portion of the proximal femur for placement of the  initial starter reamer. The starter broaches was placed  the starter broach  and was shown to go down the center of the canal. Broaching  with the  Corail system was then performed starting at size 8, coursing  Up to size 11. A size 11 had excellent torsional and rotational  and axial stability. The trial standard offset neck was then  placed  with a 28 + 5 trial head. The hip was then reduced. We confirmed that  the stem was in the canal both on AP and lateral x-rays. It also has excellent sizing. The hip was reduced with outstanding stability through full extension and full external rotation.. AP pelvis was taken and the leg lengths were measured and found to be equal. Hip was then dislocated again and the femoral head and neck removed. The  femoral broach was removed. Size 11 Corail stem with a standard offset  neck was then impacted into the femur following native anteversion. Has  excellent purchase in the canal. Excellent torsional and rotational and  axial stability. It is confirmed to be in the canal on AP and lateral  fluoroscopic views. The 28 + 5 ceramic head was placed and the hip  reduced with outstanding stability. Again AP pelvis was taken and it  confirmed that the leg lengths were equal. The wound was then copiously  irrigated with saline solution and the capsule reattached and repaired  with Ethibond suture. 30 ml of .25% Bupivicaine was  injected into the capsule and into the edge of the tensor fascia lata as well as subcutaneous tissue. The fascia overlying the tensor fascia lata was then closed with a running #1 V-Loc. Subcu was closed with interrupted 2-0 Vicryl and subcuticular running 4-0 Monocryl. Incision was cleaned  and dried. Steri-Strips and a bulky sterile dressing applied. Hemovac  drain was hooked to suction and then the patient was awakened and transported to  recovery in stable condition.        Please note that a surgical assistant was a medical necessity for this procedure to perform it in a safe and expeditious manner. Assistant was necessary to provide appropriate retraction of vital neurovascular structures and to prevent femoral fracture and allow for anatomic placement of the prosthesis.  Ollen Gross, M.D.

## 2016-12-12 NOTE — Discharge Instructions (Addendum)
° °Dr. Frank [name] °Total Joint Specialist °[facility] °[address] °[place], [place] [address] °[phone] ° °ANTERIOR APPROACH TOTAL HIP REPLACEMENT POSTOPERATIVE DIRECTIONS ° ° °Hip Rehabilitation, Guidelines Following Surgery  °The results of a hip operation are greatly improved after range of motion and muscle strengthening exercises. Follow all safety measures which are given to protect your hip. If any of these exercises cause increased pain or swelling in your joint, decrease the amount until you are comfortable again. Then slowly increase the exercises. Call your caregiver if you have problems or questions.  ° °HOME CARE INSTRUCTIONS  °Remove items at home which could result in a fall. This includes throw rugs or furniture in walking pathways.  °· ICE to the affected hip every three hours for 30 minutes at a time and then as needed for pain and swelling.  Continue to use ice on the hip for pain and swelling from surgery. You may notice swelling that will progress down to the foot and ankle.  This is normal after surgery.  Elevate the leg when you are not up walking on it.   °· Continue to use the breathing machine which will help keep your temperature down.  It is common for your temperature to cycle up and down following surgery, especially at night when you are not up moving around and exerting yourself.  The breathing machine keeps your lungs expanded and your temperature down. ° ° °DIET °You may resume your previous home diet once your are discharged from the hospital. ° °DRESSING / WOUND CARE / SHOWERING °You may shower 3 days after surgery, but keep the wounds dry during showering.  You may use an occlusive plastic wrap (Press'n Seal for example), NO SOAKING/SUBMERGING IN THE BATHTUB.  If the bandage gets wet, change with a clean dry gauze.  If the incision gets wet, pat the wound dry with a clean towel. °You may start showering once you are discharged home but do not  submerge the incision under water. Just pat the incision dry and apply a dry gauze dressing on daily. °Change the surgical dressing daily and reapply a dry dressing each time. ° °ACTIVITY °Walk with your walker as instructed. °Use walker as long as suggested by your caregivers. °Avoid periods of inactivity such as sitting longer than an hour when not asleep. This helps prevent blood clots.  °You may resume a sexual relationship in one month or when given the OK by your doctor.  °You may return to work once you are cleared by your doctor.  °Do not drive a car for 6 weeks or until released by you surgeon.  °Do not drive while taking narcotics. ° °WEIGHT BEARING °Weight bearing as tolerated with assist device (walker, cane, etc) as directed, use it as long as suggested by your surgeon or therapist, typically at least 4-6 weeks. ° °POSTOPERATIVE CONSTIPATION PROTOCOL °Constipation - defined medically as fewer than three stools per week and severe constipation as less than one stool per week. ° °One of the most common issues patients have following surgery is constipation.  Even if you have a regular bowel pattern at home, your normal regimen is likely to be disrupted due to multiple reasons following surgery.  Combination of anesthesia, postoperative narcotics, change in appetite and fluid intake all can affect your bowels.  In order to avoid complications following surgery, here are some recommendations in order to help you during your recovery period. ° °Colace (docusate) - Pick up an over-the-counter   form of Colace or another stool softener and take twice a day as long as you are requiring postoperative pain medications.  Take with a full glass of water daily.  If you experience loose stools or diarrhea, hold the colace until you stool forms back up.  If your symptoms do not get better within 1 week or if they get worse, check with your doctor. ° °Dulcolax (bisacodyl) - Pick up over-the-counter and take as directed  by the product packaging as needed to assist with the movement of your bowels.  Take with a full glass of water.  Use this product as needed if not relieved by Colace only.  ° °MiraLax (polyethylene glycol) - Pick up over-the-counter to have on hand.  MiraLax is a solution that will increase the amount of water in your bowels to assist with bowel movements.  Take as directed and can mix with a glass of water, juice, soda, coffee, or tea.  Take if you go more than two days without a movement. °Do not use MiraLax more than once per day. Call your doctor if you are still constipated or irregular after using this medication for 7 days in a row. ° °If you continue to have problems with postoperative constipation, please contact the office for further assistance and recommendations.  If you experience "the worst abdominal pain ever" or develop nausea or vomiting, please contact the office immediatly for further recommendations for treatment. ° °ITCHING ° If you experience itching with your medications, try taking only a single pain pill, or even half a pain pill at a time.  You can also use Benadryl over the counter for itching or also to help with sleep.  ° °TED HOSE STOCKINGS °Wear the elastic stockings on both legs for three weeks following surgery during the day but you may remove then at night for sleeping. ° °MEDICATIONS °See your medication summary on the “After Visit Summary” that the nursing staff will review with you prior to discharge.  You may have some home medications which will be placed on hold until you complete the course of blood thinner medication.  It is important for you to complete the blood thinner medication as prescribed by your surgeon.  Continue your approved medications as instructed at time of discharge. ° °PRECAUTIONS °If you experience chest pain or shortness of breath - call 911 immediately for transfer to the hospital emergency department.  °If you develop a fever greater that 101 F,  purulent drainage from wound, increased redness or drainage from wound, foul odor from the wound/dressing, or calf pain - CONTACT YOUR SURGEON.   °                                                °FOLLOW-UP APPOINTMENTS °Make sure you keep all of your appointments after your operation with your surgeon and caregivers. You should call the office at the above phone number and make an appointment for approximately two weeks after the date of your surgery or on the date instructed by your surgeon outlined in the "After Visit Summary". ° °RANGE OF MOTION AND STRENGTHENING EXERCISES  °These exercises are designed to help you keep full movement of your hip joint. Follow your caregiver's or physical therapist's instructions. Perform all exercises about fifteen times, three times per day or as directed. Exercise both hips, even if you   have had only one joint replacement. These exercises can be done on a training (exercise) mat, on the floor, on a table or on a bed. Use whatever works the best and is most comfortable for you. Use music or television while you are exercising so that the exercises are a pleasant break in your day. This will make your life better with the exercises acting as a break in routine you can look forward to.  °Lying on your back, slowly slide your foot toward your buttocks, raising your knee up off the floor. Then slowly slide your foot back down until your leg is straight again.  °Lying on your back spread your legs as far apart as you can without causing discomfort.  °Lying on your side, raise your upper leg and foot straight up from the floor as far as is comfortable. Slowly lower the leg and repeat.  °Lying on your back, tighten up the muscle in the front of your thigh (quadriceps muscles). You can do this by keeping your leg straight and trying to raise your heel off the floor. This helps strengthen the largest muscle supporting your knee.  °Lying on your back, tighten up the muscles of your  buttocks both with the legs straight and with the knee bent at a comfortable angle while keeping your heel on the floor.  ° °IF YOU ARE TRANSFERRED TO A SKILLED REHAB FACILITY °If the patient is transferred to a skilled rehab facility following release from the hospital, a list of the current medications will be sent to the facility for the patient to continue.  When discharged from the skilled rehab facility, please have the facility set up the patient's Home Health Physical Therapy prior to being released. Also, the skilled facility will be responsible for providing the patient with their medications at time of release from the facility to include their pain medication, the muscle relaxants, and their blood thinner medication. If the patient is still at the rehab facility at time of the two week follow up appointment, the skilled rehab facility will also need to assist the patient in arranging follow up appointment in our office and any transportation needs. ° °MAKE SURE YOU:  °Understand these instructions.  °Get help right away if you are not doing well or get worse.  ° ° °Pick up stool softner and laxative for home use following surgery while on pain medications. °Do not submerge incision under water. °Please use good hand washing techniques while changing dressing each day. °May shower starting three days after surgery. °Please use a clean towel to pat the incision dry following showers. °Continue to use ice for pain and swelling after surgery. °Do not use any lotions or creams on the incision until instructed by your surgeon. ° °Take Xarelto for two and a half more weeks following discharge from the hospital, then discontinue Xarelto. °Once the patient has completed the blood thinner regimen, then take a Baby 81 mg Aspirin daily for three more weeks. ° ° ° °Information on my medicine - XARELTO® (Rivaroxaban) ° °This medication education was reviewed with me or my healthcare representative as part of my  discharge preparation.  The pharmacist that spoke with me during my hospital stay was:   °Why was Xarelto® prescribed for you? °Xarelto® was prescribed for you to reduce the risk of blood clots forming after orthopedic surgery. The medical term for these abnormal blood clots is venous thromboembolism (VTE). ° °What do you need to know about xarelto® ? °Take   your Xarelto® ONCE DAILY at the same time every day. °You may take it either with or without food. ° °If you have difficulty swallowing the tablet whole, you may crush it and mix in applesauce just prior to taking your dose. ° °Take Xarelto® exactly as prescribed by your doctor and DO NOT stop taking Xarelto® without talking to the doctor who prescribed the medication.  Stopping without other VTE prevention medication to take the place of Xarelto® may increase your risk of developing a clot. ° °After discharge, you should have regular check-up appointments with your healthcare provider that is prescribing your Xarelto®.   ° °What do you do if you miss a dose? °If you miss a dose, take it as soon as you remember on the same day then continue your regularly scheduled once daily regimen the next day. Do not take two doses of Xarelto® on the same day.  ° °Important Safety Information °A possible side effect of Xarelto® is bleeding. You should call your healthcare provider right away if you experience any of the following: °? Bleeding from an injury or your nose that does not stop. °? Unusual colored urine (red or dark brown) or unusual colored stools (red or black). °? Unusual bruising for unknown reasons. °? A serious fall or if you hit your head (even if there is no bleeding). ° °Some medicines may interact with Xarelto® and might increase your risk of bleeding while on Xarelto®. To help avoid this, consult your healthcare provider or pharmacist prior to using any new prescription or non-prescription medications, including herbals, vitamins, non-steroidal  anti-inflammatory drugs (NSAIDs) and supplements. ° °This website has more information on Xarelto®: www.[email or web address].com. ° ° ° °

## 2016-12-13 LAB — BASIC METABOLIC PANEL
ANION GAP: 5 (ref 5–15)
BUN: 9 mg/dL (ref 6–20)
CALCIUM: 8.3 mg/dL — AB (ref 8.9–10.3)
CO2: 25 mmol/L (ref 22–32)
CREATININE: 0.63 mg/dL (ref 0.44–1.00)
Chloride: 110 mmol/L (ref 101–111)
GLUCOSE: 156 mg/dL — AB (ref 65–99)
Potassium: 4.1 mmol/L (ref 3.5–5.1)
Sodium: 140 mmol/L (ref 135–145)

## 2016-12-13 LAB — CBC
HEMATOCRIT: 33.2 % — AB (ref 36.0–46.0)
Hemoglobin: 10.7 g/dL — ABNORMAL LOW (ref 12.0–15.0)
MCH: 30.1 pg (ref 26.0–34.0)
MCHC: 32.2 g/dL (ref 30.0–36.0)
MCV: 93.3 fL (ref 78.0–100.0)
PLATELETS: 274 10*3/uL (ref 150–400)
RBC: 3.56 MIL/uL — ABNORMAL LOW (ref 3.87–5.11)
RDW: 14.8 % (ref 11.5–15.5)
WBC: 10.6 10*3/uL — AB (ref 4.0–10.5)

## 2016-12-13 MED ORDER — ONDANSETRON HCL 4 MG PO TABS: 4.0000 mg | ORAL_TABLET | Freq: Four times a day (QID) | ORAL | 0 refills | Status: DC | PRN

## 2016-12-13 MED ORDER — RIVAROXABAN 10 MG PO TABS: 10.0000 mg | ORAL_TABLET | Freq: Every day | ORAL | 0 refills | Status: DC

## 2016-12-13 MED ORDER — METHOCARBAMOL 500 MG PO TABS: 500.0000 mg | ORAL_TABLET | Freq: Four times a day (QID) | ORAL | 0 refills | Status: DC | PRN

## 2016-12-13 MED ORDER — HYDROCODONE-ACETAMINOPHEN 5-325 MG PO TABS: 2.0000 | ORAL_TABLET | ORAL | 0 refills | Status: DC | PRN

## 2016-12-13 NOTE — Progress Notes (Signed)
Discharge planning, spoke with patient and spouse at bedside. Have chosen [facility] for HH PT. Contacted [facility] for referral. Needs RW, contacted [facility] to deliver to room. [phone] 

## 2016-12-13 NOTE — Discharge Summary (Signed)
Physician Discharge Summary   Patient ID: Brandi Hines MRN: [id number] DOB/AGE: Sep 17, 1950 66 y.o.  Admit date: 12/12/2016 Discharge date: 12-13-2016  Primary Diagnosis:  Osteoarthritis of the Left hip.    Admission Diagnoses:  Past Medical History:  Diagnosis Date  . Colitis   . Complication of anesthesia   . Interstitial cystitis   . Kidney stones   . PONV (postoperative nausea and vomiting)   . UTI (lower urinary tract infection)    Discharge Diagnoses:   Principal Problem:   OA (osteoarthritis) of hip  Estimated body mass index is 23.08 kg/m as calculated from the following:   Height as of this encounter: 5' 6"  (1.676 m).   Weight as of this encounter: 64.9 kg (143 lb).  Procedure(s) (LRB): LEFT TOTAL HIP ARTHROPLASTY ANTERIOR APPROACH (Left)   Consults: None  HPI: Brandi Hines is a 66 y.o. female who has advanced end-  stage arthritis of their Left  hip with progressively worsening pain and  dysfunction.The patient has failed nonoperative management and presents for  total hip arthroplasty.     Laboratory Data: Admission on 12/12/2016  Component Date Value Ref Range Status  . WBC 12/13/2016 10.6* 4.0 - 10.5 K/uL Final  . RBC 12/13/2016 3.56* 3.87 - 5.11 MIL/uL Final  . Hemoglobin 12/13/2016 10.7* 12.0 - 15.0 g/dL Final  . HCT 12/13/2016 33.2* 36.0 - 46.0 % Final  . MCV 12/13/2016 93.3  78.0 - 100.0 fL Final  . MCH 12/13/2016 30.1  26.0 - 34.0 pg Final  . MCHC 12/13/2016 32.2  30.0 - 36.0 g/dL Final  . RDW 12/13/2016 14.8  11.5 - 15.5 % Final  . Platelets 12/13/2016 274  150 - 400 K/uL Final  . Sodium 12/13/2016 140  135 - 145 mmol/L Final  . Potassium 12/13/2016 4.1  3.5 - 5.1 mmol/L Final  . Chloride 12/13/2016 110  101 - 111 mmol/L Final  . CO2 12/13/2016 25  22 - 32 mmol/L Final  . Glucose, Bld 12/13/2016 156* 65 - 99 mg/dL Final  . BUN 12/13/2016 9  6 - 20 mg/dL Final  . Creatinine, Ser 12/13/2016 0.63  0.44 - 1.00 mg/dL Final  . Calcium  12/13/2016 8.3* 8.9 - 10.3 mg/dL Final  . GFR calc non Af Amer 12/13/2016 >60  >60 mL/min Final  . GFR calc Af Amer 12/13/2016 >60  >60 mL/min Final   Comment: (NOTE) The eGFR has been calculated using the CKD EPI equation. This calculation has not been validated in all clinical situations. eGFR's persistently <60 mL/min signify possible Chronic Kidney Disease.   Georgiann Hahn gap 12/13/2016 5  5 - 15 Final  Hospital Outpatient Visit on 12/07/2016  Component Date Value Ref Range Status  . aPTT 12/07/2016 30  24 - 36 seconds Final  . WBC 12/07/2016 6.2  4.0 - 10.5 K/uL Final  . RBC 12/07/2016 4.38  3.87 - 5.11 MIL/uL Final  . Hemoglobin 12/07/2016 13.5  12.0 - 15.0 g/dL Final  . HCT 12/07/2016 40.6  36.0 - 46.0 % Final  . MCV 12/07/2016 92.7  78.0 - 100.0 fL Final  . MCH 12/07/2016 30.8  26.0 - 34.0 pg Final  . MCHC 12/07/2016 33.3  30.0 - 36.0 g/dL Final  . RDW 12/07/2016 14.6  11.5 - 15.5 % Final  . Platelets 12/07/2016 341  150 - 400 K/uL Final  . Sodium 12/07/2016 139  135 - 145 mmol/L Final  . Potassium 12/07/2016 3.7  3.5 - 5.1 mmol/L Final  .  Chloride 12/07/2016 105  101 - 111 mmol/L Final  . CO2 12/07/2016 27  22 - 32 mmol/L Final  . Glucose, Bld 12/07/2016 74  65 - 99 mg/dL Final  . BUN 12/07/2016 10  6 - 20 mg/dL Final  . Creatinine, Ser 12/07/2016 0.75  0.44 - 1.00 mg/dL Final  . Calcium 12/07/2016 9.0  8.9 - 10.3 mg/dL Final  . Total Protein 12/07/2016 6.8  6.5 - 8.1 g/dL Final  . Albumin 12/07/2016 3.6  3.5 - 5.0 g/dL Final  . AST 12/07/2016 35  15 - 41 U/L Final  . ALT 12/07/2016 35  14 - 54 U/L Final  . Alkaline Phosphatase 12/07/2016 107  38 - 126 U/L Final  . Total Bilirubin 12/07/2016 0.6  0.3 - 1.2 mg/dL Final  . GFR calc non Af Amer 12/07/2016 >60  >60 mL/min Final  . GFR calc Af Amer 12/07/2016 >60  >60 mL/min Final   Comment: (NOTE) The eGFR has been calculated using the CKD EPI equation. This calculation has not been validated in all clinical  situations. eGFR's persistently <60 mL/min signify possible Chronic Kidney Disease.   . Anion gap 12/07/2016 7  5 - 15 Final  . Prothrombin Time 12/07/2016 12.6  11.4 - 15.2 seconds Final  . INR 12/07/2016 0.95   Final  . ABO/RH(D) 12/07/2016 A POS   Final  . Antibody Screen 12/07/2016 NEG   Final  . Sample Expiration 12/07/2016 12/15/2016   Final  . Extend sample reason 12/07/2016 NO TRANSFUSIONS OR PREGNANCY IN THE PAST 3 MONTHS   Final  . MRSA, PCR 12/07/2016 NEGATIVE  NEGATIVE Final  . Staphylococcus aureus 12/07/2016 NEGATIVE  NEGATIVE Final   Comment: (NOTE) The Xpert SA Assay (FDA approved for NASAL specimens in patients 35 years of age and older), is one component of a comprehensive surveillance program. It is not intended to diagnose infection nor to guide or monitor treatment.   . ABO/RH(D) 12/07/2016 A POS   Final     X-Rays:Dg Pelvis Portable  Result Date: 12/12/2016 CLINICAL DATA:  Status post left hip replacement. EXAM: PORTABLE PELVIS 1-2 VIEWS COMPARISON:  None. FINDINGS: Left hip arthroplasty hardware appears intact and appropriately positioned. Surrounding osseous structures appear intact and anatomic in alignment. Drainage catheter overlies the lateral portion of the left hip joint. Expected postsurgical changes within the surrounding soft tissues. IMPRESSION: Left hip arthroplasty hardware appears appropriately positioned. No evidence of surgical complicating feature. Electronically Signed   By: Franki Cabot M.D.   On: 12/12/2016 13:21   Dg C-arm 1-60 Min-no Report  Result Date: 12/12/2016 Fluoroscopy was utilized by the requesting physician.  No radiographic interpretation.    EKG: Orders placed or performed during the hospital encounter of 12/12/16  . EKG 12-Lead  . EKG 12-Lead     Hospital Course: Patient was admitted to [facility] and taken to the OR and underwent the above state procedure without complications.  Patient tolerated the procedure  well and was later transferred to the recovery room and then to the orthopaedic floor for postoperative care.  They were given PO and IV analgesics for pain control following their surgery.  They were given 24 hours of postoperative antibiotics of  Anti-infectives (From admission, onward)   Start     Dose/Rate Route Frequency Ordered Stop   12/12/16 1700  ceFAZolin (ANCEF) IVPB 1 g/50 mL premix     1 g 100 mL/hr over 30 Minutes Intravenous Every 6 hours 12/12/16 1512 12/13/16 0451  12/12/16 0937  ceFAZolin (ANCEF) 2-4 GM/100ML-% IVPB    Comments:  Bridget Hartshorn   : cabinet override      12/12/16 6433 12/12/16 1105   12/12/16 0930  ceFAZolin (ANCEF) IVPB 2g/100 mL premix  Status:  Discontinued     2 g 200 mL/hr over 30 Minutes Intravenous On call to O.R. 12/12/16 0930 12/12/16 0942   12/12/16 0930  ceFAZolin (ANCEF) IVPB 2g/100 mL premix     2 g 200 mL/hr over 30 Minutes Intravenous On call to O.R. 12/12/16 0930 12/12/16 1120     and started on DVT prophylaxis in the form of Xarelto.   PT and OT were ordered for total hip protocol.  The patient was allowed to be WBAT with therapy. Discharge planning was consulted to help with postop disposition and equipment needs.  Patient had a good night on the evening of surgery.  They started to get up OOB with therapy on day one.  Hemovac drain was pulled without difficulty. Dressing was checked and was clean and dry.  Patient was seen in rounds by Dr. Wynelle Link and was ready to go home.   Discharge home with home health Diet - Regular diet Follow up - in 2 weeks Activity - WBAT Disposition - Home Condition Upon Discharge - improving D/C Meds - See DC Summary DVT Prophylaxis - Xarelto    Discharge Instructions    Call MD / Call 911   Complete by:  As directed    If you experience chest pain or shortness of breath, CALL 911 and be transported to the hospital emergency room.  If you develope a fever above 101 F, pus (white drainage) or increased  drainage or redness at the wound, or calf pain, call your surgeon's office.   Change dressing   Complete by:  As directed    You may change your dressing dressing daily with sterile 4 x 4 inch gauze dressing and paper tape.  Do not submerge the incision under water.   Constipation Prevention   Complete by:  As directed    Drink plenty of fluids.  Prune juice may be helpful.  You may use a stool softener, such as Colace (over the counter) 100 mg twice a day.  Use MiraLax (over the counter) for constipation as needed.   Diet - low sodium heart healthy   Complete by:  As directed    Discharge instructions   Complete by:  As directed    Take Xarelto for two and a half more weeks, then discontinue Xarelto. Once the patient has completed the blood thinner regimen, then take a Baby 81 mg Aspirin daily for three more weeks.   Pick up stool softner and laxative for home use following surgery while on pain medications. Do not submerge incision under water. Please use good hand washing techniques while changing dressing each day. May shower starting three days after surgery. Please use a clean towel to pat the incision dry following showers. Continue to use ice for pain and swelling after surgery. Do not use any lotions or creams on the incision until instructed by your surgeon.  Wear both TED hose on both legs during the day every day for three weeks, but may remove the TED hose at night at home.  Postoperative Constipation Protocol  Constipation - defined medically as fewer than three stools per week and severe constipation as less than one stool per week.  One of the most common issues patients have following surgery is  constipation.  Even if you have a regular bowel pattern at home, your normal regimen is likely to be disrupted due to multiple reasons following surgery.  Combination of anesthesia, postoperative narcotics, change in appetite and fluid intake all can affect your bowels.  In order  to avoid complications following surgery, here are some recommendations in order to help you during your recovery period.  Colace (docusate) - Pick up an over-the-counter form of Colace or another stool softener and take twice a day as long as you are requiring postoperative pain medications.  Take with a full glass of water daily.  If you experience loose stools or diarrhea, hold the colace until you stool forms back up.  If your symptoms do not get better within 1 week or if they get worse, check with your doctor.  Dulcolax (bisacodyl) - Pick up over-the-counter and take as directed by the product packaging as needed to assist with the movement of your bowels.  Take with a full glass of water.  Use this product as needed if not relieved by Colace only.   MiraLax (polyethylene glycol) - Pick up over-the-counter to have on hand.  MiraLax is a solution that will increase the amount of water in your bowels to assist with bowel movements.  Take as directed and can mix with a glass of water, juice, soda, coffee, or tea.  Take if you go more than two days without a movement. Do not use MiraLax more than once per day. Call your doctor if you are still constipated or irregular after using this medication for 7 days in a row.  If you continue to have problems with postoperative constipation, please contact the office for further assistance and recommendations.  If you experience "the worst abdominal pain ever" or develop nausea or vomiting, please contact the office immediatly for further recommendations for treatment.   Do not sit on low chairs, stoools or toilet seats, as it may be difficult to get up from low surfaces   Complete by:  As directed    Driving restrictions   Complete by:  As directed    No driving until released by the physician.   Increase activity slowly as tolerated   Complete by:  As directed    Lifting restrictions   Complete by:  As directed    No lifting until released by the  physician.   Patient may shower   Complete by:  As directed    You may shower without a dressing once there is no drainage.  Do not wash over the wound.  If drainage remains, do not shower until drainage stops.   TED hose   Complete by:  As directed    Use stockings (TED hose) for 3 weeks on both leg(s).  You may remove them at night for sleeping.   Weight bearing as tolerated   Complete by:  As directed    Laterality:  left   Extremity:  Lower     Allergies as of 12/13/2016   No Known Allergies     Medication List    STOP taking these medications   estradiol 0.5 MG tablet Commonly known as:  ESTRACE   folic acid 1 MG tablet Commonly known as:  FOLVITE   methotrexate 2.5 MG tablet   Probiotic Caps     TAKE these medications   amitriptyline 50 MG tablet Commonly known as:  ELAVIL Take 1 tablet (50 mg total) by mouth at bedtime.   butalbital-acetaminophen-caffeine 50-325-40 MG  tablet Commonly known as:  FIORICET, ESGIC Take 1-2 tablets by mouth as needed for migraine.   FLUoxetine 40 MG capsule Commonly known as:  PROZAC Take 40 mg by mouth daily.   HYDROcodone-acetaminophen 5-325 MG tablet Commonly known as:  NORCO/VICODIN Take 2 tablets every 4 (four) hours as needed by mouth for severe pain ((score 7 to 10)).   KLS ALLER-TEC PO Take 10 mg by mouth daily.   methocarbamol 500 MG tablet Commonly known as:  ROBAXIN Take 1 tablet (500 mg total) every 6 (six) hours as needed by mouth for muscle spasms.   NASACORT AQ NA Place 1 spray into the nose as needed (congestion).   ondansetron 4 MG tablet Commonly known as:  ZOFRAN Take 1 tablet (4 mg total) every 6 (six) hours as needed by mouth for nausea.   rivaroxaban 10 MG Tabs tablet Commonly known as:  XARELTO Take 1 tablet (10 mg total) daily with breakfast by mouth. Take Xarelto for two and a half more weeks following discharge from the hospital, then discontinue Xarelto. Once the patient has completed the  blood thinner regimen, then take a Baby 81 mg Aspirin daily for three more weeks. Start taking on:  12/14/2016            Discharge Care Instructions  (From admission, onward)        Start     Ordered   12/13/16 0000  Weight bearing as tolerated    Question Answer Comment  Laterality left   Extremity Lower      12/13/16 0817   12/13/16 0000  Change dressing    Comments:  You may change your dressing dressing daily with sterile 4 x 4 inch gauze dressing and paper tape.  Do not submerge the incision under water.   12/13/16 1537     Follow-up Information    Gaynelle Arabian, MD. Schedule an appointment as soon as possible for a visit on 12/25/2016.   Specialty:  Orthopedic Surgery Contact information: [address] [place] [address] [phone]           Signed: Arlee Muslim, PA-C Orthopaedic Surgery 12/13/2016, 8:18 AM

## 2016-12-13 NOTE — Progress Notes (Signed)
   Subjective: 1 Day Post-Op Procedure(s) (LRB): LEFT TOTAL HIP ARTHROPLASTY ANTERIOR APPROACH (Left) Patient reports pain as mild.   Patient seen in rounds with Dr. Lequita Halt. Patient is well, but has had some minor complaints of pain in the hip, requiring pain medications We will start therapy today.  If they do well with therapy and meets all goals, then will allow home later this afternoon following therapy. Plan is to go Home after hospital stay.  Objective: Vital signs in last 24 hours: Temp:  [97.3 F (36.3 C)-98.8 F (37.1 C)] 98.6 F (37 C) (11/08 0600) Pulse Rate:  [37-85] 66 (11/08 0600) Resp:  [11-17] 16 (11/08 0600) BP: (100-136)/(56-86) 103/56 (11/08 0600) SpO2:  [95 %-100 %] 96 % (11/08 0600) Weight:  [64.9 kg (143 lb)-65.8 kg (145 lb)] 64.9 kg (143 lb) (11/07 1513)  Intake/Output from previous day:  Intake/Output Summary (Last 24 hours) at 12/13/2016 0728 Last data filed at 12/13/2016 0605 Gross per 24 hour  Intake 4660 ml  Output 3045 ml  Net 1615 ml    Intake/Output this shift: No intake/output data recorded.  Labs: Recent Labs    12/13/16 0543  HGB 10.7*   Recent Labs    12/13/16 0543  WBC 10.6*  RBC 3.56*  HCT 33.2*  PLT 274   Recent Labs    12/13/16 0543  NA 140  K 4.1  CL 110  CO2 25  BUN 9  CREATININE 0.63  GLUCOSE 156*  CALCIUM 8.3*   No results for input(s): LABPT, INR in the last 72 hours.  EXAM General - Patient is Alert, Appropriate and Oriented Extremity - Neurovascular intact Sensation intact distally Intact pulses distally Dorsiflexion/Plantar flexion intact Dressing - dressing C/D/I Motor Function - intact, moving foot and toes well on exam.  Hemovac pulled without difficulty.  Past Medical History:  Diagnosis Date  . Colitis   . Complication of anesthesia   . Interstitial cystitis   . Kidney stones   . PONV (postoperative nausea and vomiting)   . UTI (lower urinary tract infection)     Assessment/Plan: 1  Day Post-Op Procedure(s) (LRB): LEFT TOTAL HIP ARTHROPLASTY ANTERIOR APPROACH (Left) Principal Problem:   OA (osteoarthritis) of hip  Estimated body mass index is 23.08 kg/m as calculated from the following:   Height as of this encounter: 5\' 6"  (1.676 m).   Weight as of this encounter: 64.9 kg (143 lb). Up with therapy Discharge home with home health  DVT Prophylaxis - Xarelto Weight Bearing As Tolerated left Leg Hemovac Pulled Begin Therapy  If meets goals and able to go home: Up with therapy Discharge home with home health Diet - Regular diet Follow up - in 2 weeks Activity - WBAT Disposition - Home Condition Upon Discharge - improving D/C Meds - See DC Summary DVT Prophylaxis - Xarelto  , PA-C Orthopaedic Surgery 12/13/2016, 7:28 AM

## 2016-12-13 NOTE — Progress Notes (Signed)
Physical Therapy Treatment Patient Details Name: Brandi Hines MRN: [id number] DOB: 09/24/1950 Today's Date: 12/13/2016    History of Present Illness Pt s/p L THR    PT Comments    Pt motivated and progressing well with mobility.   Follow Up Recommendations  Home health PT;DC plan and follow up therapy as arranged by surgeon     Equipment Recommendations  Rolling walker with 5" wheels    Recommendations for Other Services       Precautions / Restrictions Precautions Precautions: Fall Restrictions Weight Bearing Restrictions: No Other Position/Activity Restrictions: WBAT    Mobility  Bed Mobility Overal bed mobility: Needs Assistance Bed Mobility: Supine to Sit     Supine to sit: Min assist     General bed mobility comments: cues for sequence and use of R LE to self assist  Transfers Overall transfer level: Needs assistance Equipment used: Rolling walker (2 wheeled) Transfers: Sit to/from Stand Sit to Stand: Min assist;Min guard         General transfer comment: cues for LE management and use of UEs to self assist  Ambulation/Gait Ambulation/Gait assistance: Min assist;Min guard Ambulation Distance (Feet): 200 Feet Assistive device: Rolling walker (2 wheeled) Gait Pattern/deviations: Step-to pattern;Decreased step length - right;Decreased step length - left;Shuffle;Trunk flexed Gait velocity: decr Gait velocity interpretation: Below normal speed for age/gender General Gait Details: cues for sequence, posture and position from RW.  Distance ltd by onset of dizziness   Stairs            Wheelchair Mobility    Modified Rankin (Stroke Patients Only)       Balance                                            Cognition Arousal/Alertness: Awake/alert Behavior During Therapy: WFL for tasks assessed/performed Overall Cognitive Status: Within Functional Limits for tasks assessed                                         Exercises Total Joint Exercises Ankle Circles/Pumps: AROM;Both;15 reps;Supine Quad Sets: AROM;Both;10 reps;Supine Heel Slides: AAROM;Left;20 reps;Supine Hip ABduction/ADduction: AAROM;Left;15 reps;Supine    General Comments        Pertinent Vitals/Pain Pain Assessment: 0-10 Pain Score: 2  Pain Location: L hip Pain Descriptors / Indicators: Aching;Sore Pain Intervention(s): Limited activity within patient's tolerance;Monitored during session;Premedicated before session;Ice applied    Home Living                      Prior Function            PT Goals (current goals can now be found in the care plan section) Acute Rehab PT Goals Patient Stated Goal: Regain IND PT Goal Formulation: With patient Time For Goal Achievement: 12/15/16 Potential to Achieve Goals: Good Progress towards PT goals: Progressing toward goals    Frequency    7X/week      PT Plan Current plan remains appropriate    Co-evaluation              AM-PAC PT "6 Clicks" Daily Activity  Outcome Measure  Difficulty turning over in bed (including adjusting bedclothes, sheets and blankets)?: Unable Difficulty moving from lying on back to sitting on the side of the bed? :  Unable Difficulty sitting down on and standing up from a chair with arms (e.g., wheelchair, bedside commode, etc,.)?: Unable Help needed moving to and from a bed to chair (including a wheelchair)?: A Little Help needed walking in hospital room?: A Little Help needed climbing 3-5 steps with a railing? : A Little 6 Click Score: 12    End of Session Equipment Utilized During Treatment: Gait belt Activity Tolerance: Patient tolerated treatment well;Other (comment) Patient left: in chair Nurse Communication: Mobility status PT Visit Diagnosis: Difficulty in walking, not elsewhere classified (R26.2)     Time: 0272-5366 PT Time Calculation (min) (ACUTE ONLY): 34 min  Charges:  $Gait Training: 8-22  mins $Therapeutic Exercise: 8-22 mins                    G Codes:       Pg [phone]    Maksym Pfiffner 12/13/2016, 12:36 PM

## 2016-12-13 NOTE — Progress Notes (Signed)
Physical Therapy Treatment Patient Details Name: Brandi Hines MRN: [id number] DOB: 1950/04/04 Today's Date: 12/13/2016    History of Present Illness Pt s/p L THR    PT Comments    Pt motivated and progressing well.  Dtr present and reviewed stairs, car transfers and home therex.   Follow Up Recommendations  Home health PT;DC plan and follow up therapy as arranged by surgeon     Equipment Recommendations  Rolling walker with 5" wheels    Recommendations for Other Services       Precautions / Restrictions Precautions Precautions: Fall Restrictions Weight Bearing Restrictions: No Other Position/Activity Restrictions: WBAT    Mobility  Bed Mobility Overal bed mobility: Needs Assistance Bed Mobility: Sit to Supine       Sit to supine: Min assist   General bed mobility comments: cues for sequence and use of R LE to self assist  Transfers Overall transfer level: Needs assistance Equipment used: Rolling walker (2 wheeled) Transfers: Sit to/from Stand Sit to Stand: Min guard;Supervision         General transfer comment: cues for LE management and use of UEs to self assist  Ambulation/Gait Ambulation/Gait assistance: Min guard;Supervision Ambulation Distance (Feet): 222 Feet Assistive device: Rolling walker (2 wheeled) Gait Pattern/deviations: Step-to pattern;Decreased step length - right;Decreased step length - left;Shuffle;Trunk flexed Gait velocity: decr Gait velocity interpretation: Below normal speed for age/gender General Gait Details: cues for posture and position from RW.  Distance ltd by onset of dizziness   Stairs Stairs: Yes   Stair Management: No rails;Step to pattern;Forwards;With walker Number of Stairs: 4 General stair comments: 2 stairs twice with RW and cues for sequence and foot/RW placement  Wheelchair Mobility    Modified Rankin (Stroke Patients Only)       Balance                                             Cognition Arousal/Alertness: Awake/alert Behavior During Therapy: WFL for tasks assessed/performed Overall Cognitive Status: Within Functional Limits for tasks assessed                                        Exercises      General Comments        Pertinent Vitals/Pain Pain Assessment: 0-10 Pain Score: 3  Pain Location: L hip Pain Descriptors / Indicators: Aching;Sore Pain Intervention(s): Limited activity within patient's tolerance;Monitored during session;Premedicated before session;Patient requesting pain meds-RN notified    Home Living                      Prior Function            PT Goals (current goals can now be found in the care plan section) Acute Rehab PT Goals Patient Stated Goal: Regain IND PT Goal Formulation: With patient Time For Goal Achievement: 12/15/16 Potential to Achieve Goals: Good Progress towards PT goals: Progressing toward goals    Frequency    7X/week      PT Plan Current plan remains appropriate    Co-evaluation              AM-PAC PT "6 Clicks" Daily Activity  Outcome Measure  Difficulty turning over in bed (including adjusting bedclothes, sheets and blankets)?: A Lot Difficulty  moving from lying on back to sitting on the side of the bed? : A Lot Difficulty sitting down on and standing up from a chair with arms (e.g., wheelchair, bedside commode, etc,.)?: A Lot Help needed moving to and from a bed to chair (including a wheelchair)?: A Little Help needed walking in hospital room?: A Little Help needed climbing 3-5 steps with a railing? : A Little 6 Click Score: 15    End of Session Equipment Utilized During Treatment: Gait belt Activity Tolerance: Patient tolerated treatment well Patient left: in bed;with call bell/phone within reach;with family/visitor present Nurse Communication: Mobility status PT Visit Diagnosis: Difficulty in walking, not elsewhere classified (R26.2)     Time:  6269-4854 PT Time Calculation (min) (ACUTE ONLY): 28 min  Charges:  $Gait Training: 8-22 mins $Therapeutic Activity: 8-22 mins                    G Codes:       Pg [phone]    Shawndell Varas 12/13/2016, 5:03 PM

## 2017-03-22 ENCOUNTER — Other Ambulatory Visit: Payer: Self-pay | Admitting: Obstetrics

## 2017-03-22 DIAGNOSIS — Z139 Encounter for screening, unspecified: Secondary | ICD-10-CM

## 2017-04-26 ENCOUNTER — Ambulatory Visit
Admission: RE | Admit: 2017-04-26 | Discharge: 2017-04-26 | Disposition: A | Discharge status: Home / Self Care | Payer: Medicare Other | Source: Ambulatory Visit | Attending: Obstetrics | Admitting: Obstetrics

## 2017-04-26 DIAGNOSIS — Z139 Encounter for screening, unspecified: Secondary | ICD-10-CM

## 2017-06-17 NOTE — Patient Instructions (Addendum)
Brandi Hines  06/17/2017   Your procedure is scheduled on: 06/26/2017   Report to [facility] Main  Entrance   Report to admitting at    0515 AM    Call this number if you have problems the morning of surgery [phone]    Remember: Do not eat food or drink liquids :After Midnight.     Take these medicines the morning of surgery with A SIP OF WATER: Prozac, Ativan if needed, Nasacort if needed                                 You may not have any metal on your body including hair pins and              piercings  Do not wear jewelry, make-up, lotions, powders or perfumes, deodorant             Do not wear nail polish.  Do not shave  48 hours prior to surgery.                 Do not bring valuables to the hospital. [place] IS NOT             RESPONSIBLE   FOR VALUABLES.  Contacts, dentures or bridgework may not be worn into surgery.  Leave suitcase in the car. After surgery it may be brought to your room.                       Please read over the following fact sheets you were given: _____________________________________________________________________             [facility] - Preparing for Surgery Before surgery, you can play an important role.  Because skin is not sterile, your skin needs to be as free of germs as possible.  You can reduce the number of germs on your skin by washing with CHG (chlorahexidine gluconate) soap before surgery.  CHG is an antiseptic cleaner which kills germs and bonds with the skin to continue killing germs even after washing. Please DO NOT use if you have an allergy to CHG or antibacterial soaps.  If your skin becomes reddened/irritated stop using the CHG and inform your nurse when you arrive at Short Stay. Do not shave (including legs and underarms) for at least 48 hours prior to the first CHG shower.  You may shave your face/neck. Please follow these instructions carefully:  1.  Shower with CHG Soap the night  before surgery and the  morning of Surgery.  2.  If you choose to wash your hair, wash your hair first as usual with your  normal  shampoo.  3.  After you shampoo, rinse your hair and body thoroughly to remove the  shampoo.                           4.  Use CHG as you would any other liquid soap.  You can apply chg directly  to the skin and wash                       Gently with a scrungie or clean washcloth.  5.  Apply the CHG Soap to your body ONLY FROM THE NECK DOWN.   Do not  use on face/ open                           Wound or open sores. Avoid contact with eyes, ears mouth and genitals (private parts).                       Wash face,  Genitals (private parts) with your normal soap.             6.  Wash thoroughly, paying special attention to the area where your surgery  will be performed.  7.  Thoroughly rinse your body with warm water from the neck down.  8.  DO NOT shower/wash with your normal soap after using and rinsing off  the CHG Soap.                9.  Pat yourself dry with a clean towel.            10.  Wear clean pajamas.            11.  Place clean sheets on your bed the night of your first shower and do not  sleep with pets. Day of Surgery : Do not apply any lotions/deodorants the morning of surgery.  Please wear clean clothes to the hospital/surgery center.  FAILURE TO FOLLOW THESE INSTRUCTIONS MAY RESULT IN THE CANCELLATION OF YOUR SURGERY PATIENT SIGNATURE_________________________________  NURSE SIGNATURE__________________________________  ________________________________________________________________________  WHAT IS A BLOOD TRANSFUSION? Blood Transfusion Information  A transfusion is the replacement of blood or some of its parts. Blood is made up of multiple cells which provide different functions.  Red blood cells carry oxygen and are used for blood loss replacement.  White blood cells fight against infection.  Platelets control bleeding.  Plasma helps clot  blood.  Other blood products are available for specialized needs, such as hemophilia or other clotting disorders. BEFORE THE TRANSFUSION  Who gives blood for transfusions?   Healthy volunteers who are fully evaluated to make sure their blood is safe. This is blood bank blood. Transfusion therapy is the safest it has ever been in the practice of medicine. Before blood is taken from a donor, a complete history is taken to make sure that person has no history of diseases nor engages in risky social behavior (examples are intravenous drug use or sexual activity with multiple partners). The donor's travel history is screened to minimize risk of transmitting infections, such as malaria. The donated blood is tested for signs of infectious diseases, such as HIV and hepatitis. The blood is then tested to be sure it is compatible with you in order to minimize the chance of a transfusion reaction. If you or a relative donates blood, this is often done in anticipation of surgery and is not appropriate for emergency situations. It takes many days to process the donated blood. RISKS AND COMPLICATIONS Although transfusion therapy is very safe and saves many lives, the main dangers of transfusion include:   Getting an infectious disease.  Developing a transfusion reaction. This is an allergic reaction to something in the blood you were given. Every precaution is taken to prevent this. The decision to have a blood transfusion has been considered carefully by your caregiver before blood is given. Blood is not given unless the benefits outweigh the risks. AFTER THE TRANSFUSION  Right after receiving a blood transfusion, you will usually feel much better and more energetic. This is especially true if  your red blood cells have gotten low (anemic). The transfusion raises the level of the red blood cells which carry oxygen, and this usually causes an energy increase.  The nurse administering the transfusion will monitor  you carefully for complications. HOME CARE INSTRUCTIONS  No special instructions are needed after a transfusion. You may find your energy is better. Speak with your caregiver about any limitations on activity for underlying diseases you may have. SEEK MEDICAL CARE IF:   Your condition is not improving after your transfusion.  You develop redness or irritation at the intravenous (IV) site. SEEK IMMEDIATE MEDICAL CARE IF:  Any of the following symptoms occur over the next 12 hours:  Shaking chills.  You have a temperature by mouth above 102 F (38.9 C), not controlled by medicine.  Chest, back, or muscle pain.  People around you feel you are not acting correctly or are confused.  Shortness of breath or difficulty breathing.  Dizziness and fainting.  You get a rash or develop hives.  You have a decrease in urine output.  Your urine turns a dark color or changes to pink, red, or brown. Any of the following symptoms occur over the next 10 days:  You have a temperature by mouth above 102 F (38.9 C), not controlled by medicine.  Shortness of breath.  Weakness after normal activity.  The white part of the eye turns yellow (jaundice).  You have a decrease in the amount of urine or are urinating less often.  Your urine turns a dark color or changes to pink, red, or brown. Document Released: 01/20/2000 Document Revised: 04/16/2011 Document Reviewed: 09/08/2007 Exit[employer] Patient Information 2014 [place].  _______________________________________________________________________  Incentive Spirometer  An incentive spirometer is a tool that can help keep your lungs clear and active. This tool measures how well you are filling your lungs with each breath. Taking long deep breaths may help reverse or decrease the chance of developing breathing (pulmonary) problems (especially infection) following:  A long period of time when you are unable to move or be active. BEFORE THE  PROCEDURE   If the spirometer includes an indicator to show your best effort, your nurse or respiratory therapist will set it to a desired goal.  If possible, sit up straight or lean slightly forward. Try not to slouch.  Hold the incentive spirometer in an upright position. INSTRUCTIONS FOR USE  1. Sit on the edge of your bed if possible, or sit up as far as you can in bed or on a chair. 2. Hold the incentive spirometer in an upright position. 3. Breathe out normally. 4. Place the mouthpiece in your mouth and seal your lips tightly around it. 5. Breathe in slowly and as deeply as possible, raising the piston or the ball toward the top of the column. 6. Hold your breath for 3-5 seconds or for as long as possible. Allow the piston or ball to fall to the bottom of the column. 7. Remove the mouthpiece from your mouth and breathe out normally. 8. Rest for a few seconds and repeat Steps 1 through 7 at least 10 times every 1-2 hours when you are awake. Take your time and take a few normal breaths between deep breaths. 9. The spirometer may include an indicator to show your best effort. Use the indicator as a goal to work toward during each repetition. 10. After each set of 10 deep breaths, practice coughing to be sure your lungs are clear. If you have an incision (  the cut made at the time of surgery), support your incision when coughing by placing a pillow or rolled up towels firmly against it. Once you are able to get out of bed, walk around indoors and cough well. You may stop using the incentive spirometer when instructed by your caregiver.  RISKS AND COMPLICATIONS  Take your time so you do not get dizzy or light-headed.  If you are in pain, you may need to take or ask for pain medication before doing incentive spirometry. It is harder to take a deep breath if you are having pain. AFTER USE  Rest and breathe slowly and easily.  It can be helpful to keep track of a log of your progress. Your  caregiver can provide you with a simple table to help with this. If you are using the spirometer at home, follow these instructions: [place] IF:   You are having difficultly using the spirometer.  You have trouble using the spirometer as often as instructed.  Your pain medication is not giving enough relief while using the spirometer.  You develop fever of 100.5 F (38.1 C) or higher. SEEK IMMEDIATE MEDICAL CARE IF:   You cough up bloody sputum that had not been present before.  You develop fever of 102 F (38.9 C) or greater.  You develop worsening pain at or near the incision site. MAKE SURE YOU:   Understand these instructions.  Will watch your condition.  Will get help right away if you are not doing well or get worse. Document Released: 06/04/2006 Document Revised: 04/16/2011 Document Reviewed: 08/05/2006 [facility] Patient Information 2014 [place], [place].   ________________________________________________________________________

## 2017-06-17 NOTE — Progress Notes (Signed)
ekg-epic-12/14/2016

## 2017-06-18 ENCOUNTER — Other Ambulatory Visit: Payer: Self-pay

## 2017-06-18 ENCOUNTER — Encounter (HOSPITAL_COMMUNITY): Payer: Self-pay | Admitting: *Deleted

## 2017-06-18 ENCOUNTER — Encounter (HOSPITAL_COMMUNITY)
Admission: RE | Admit: 2017-06-18 | Discharge: 2017-06-18 | Disposition: A | Discharge status: Home / Self Care | Payer: Medicare Other | Source: Ambulatory Visit | Attending: Orthopedic Surgery | Admitting: Orthopedic Surgery

## 2017-06-18 DIAGNOSIS — Z01812 Encounter for preprocedural laboratory examination: Secondary | ICD-10-CM | POA: Insufficient documentation

## 2017-06-18 DIAGNOSIS — M1611 Unilateral primary osteoarthritis, right hip: Secondary | ICD-10-CM | POA: Insufficient documentation

## 2017-06-18 HISTORY — DX: Unspecified osteoarthritis, unspecified site: M19.90

## 2017-06-18 LAB — URINALYSIS, ROUTINE W REFLEX MICROSCOPIC
Bilirubin Urine: NEGATIVE
Glucose, UA: NEGATIVE mg/dL
Ketones, ur: NEGATIVE mg/dL
Leukocytes, UA: NEGATIVE
Nitrite: NEGATIVE
Protein, ur: NEGATIVE mg/dL
Specific Gravity, Urine: 1.015 (ref 1.005–1.030)
pH: 6.5 (ref 5.0–8.0)

## 2017-06-18 LAB — PROTIME-INR
INR: 0.99
Prothrombin Time: 13 seconds (ref 11.4–15.2)

## 2017-06-18 LAB — COMPREHENSIVE METABOLIC PANEL
ALT: 15 U/L (ref 14–54)
AST: 24 U/L (ref 15–41)
Albumin: 3.5 g/dL (ref 3.5–5.0)
Alkaline Phosphatase: 82 U/L (ref 38–126)
Anion gap: 9 (ref 5–15)
BUN: 14 mg/dL (ref 6–20)
CO2: 24 mmol/L (ref 22–32)
Calcium: 8.6 mg/dL — ABNORMAL LOW (ref 8.9–10.3)
Chloride: 105 mmol/L (ref 101–111)
Creatinine, Ser: 0.78 mg/dL (ref 0.44–1.00)
GFR calc Af Amer: >60 mL/min (ref >60)
GFR calc non Af Amer: >60 mL/min (ref >60)
Glucose, Bld: 70 mg/dL (ref 65–99)
Potassium: 3.8 mmol/L (ref 3.5–5.1)
Sodium: 138 mmol/L (ref 135–145)
Total Bilirubin: 0.1 mg/dL — ABNORMAL LOW (ref 0.3–1.2)
Total Protein: 6.8 g/dL (ref 6.5–8.1)

## 2017-06-18 LAB — URINALYSIS, MICROSCOPIC (REFLEX)

## 2017-06-18 LAB — CBC
HCT: 39.7 % (ref 36.0–46.0)
Hemoglobin: 12.5 g/dL (ref 12.0–15.0)
MCH: 27.1 pg (ref 26.0–34.0)
MCHC: 31.5 g/dL (ref 30.0–36.0)
MCV: 85.9 fL (ref 78.0–100.0)
Platelets: 346 10*3/uL (ref 150–400)
RBC: 4.62 MIL/uL (ref 3.87–5.11)
RDW: 15.4 % (ref 11.5–15.5)
WBC: 5.1 10*3/uL (ref 4.0–10.5)

## 2017-06-18 LAB — SURGICAL PCR SCREEN
MRSA, PCR: NEGATIVE
STAPHYLOCOCCUS AUREUS: NEGATIVE

## 2017-06-25 MED ORDER — SODIUM CHLORIDE 0.9 % IV SOLN
1000.0000 mg | INTRAVENOUS | Status: AC
  Administered 2017-06-26: 1000 mg via INTRAVENOUS
  Filled 2017-06-25: qty 1100

## 2017-06-25 NOTE — H&P (Signed)
TOTAL HIP ADMISSION H&P  Patient is admitted for right total hip arthroplasty.  Subjective:  Chief Complaint: right hip pain  HPI: Brandi Hines, 67 y.o. female, has a history of pain and functional disability in the right hip(s) due to arthritis and patient has failed non-surgical conservative treatments for greater than 12 weeks to include NSAID's and/or analgesics, corticosteriod injections, flexibility and strengthening excercises and activity modification.  Onset of symptoms was gradual starting 3 years ago with gradually worsening course since that time.The patient noted no past surgery on the right hip(s).  Patient currently rates pain in the right hip at 8 out of 10 with activity. Patient has night pain, worsening of pain with activity and weight bearing, pain that interfers with activities of daily living and pain with passive range of motion. Patient has evidence of periarticular osteophytes and joint space narrowing by imaging studies. This condition presents safety issues increasing the risk of falls.  There is no current active infection.  Patient Active Problem List   Diagnosis Date Noted  . OA (osteoarthritis) of hip 12/12/2016  . Rheumatoid arthritis (HCC) 06/07/2016  . Bilateral hip pain 04/26/2016  . Weakness of both lower extremities 04/26/2016  . Abdominal pain, left lower quadrant 01/08/2013  . Diarrhea 01/08/2013  . Rectal bleeding 01/08/2013   Past Medical History:  Diagnosis Date  . Arthritis   . Colitis   . Complication of anesthesia   . Interstitial cystitis   . Kidney stones   . PONV (postoperative nausea and vomiting)   . UTI (lower urinary tract infection)     Past Surgical History:  Procedure Laterality Date  . ABDOMINAL HYSTERECTOMY     partial  . APPENDECTOMY  1991  . COLONOSCOPY N/A 02/12/2013   Procedure: COLONOSCOPY;  Surgeon: Hart Carwin, MD;  Location: [facility] ENDOSCOPY;  Service: Endoscopy;  Laterality: N/A;  . JOINT REPLACEMENT    . PARTIAL  HYSTERECTOMY  1990  . TOTAL HIP ARTHROPLASTY Left 12/12/2016   Procedure: LEFT TOTAL HIP ARTHROPLASTY ANTERIOR APPROACH;  Surgeon: Ollen Gross, MD;  Location: [facility] ORS;  Service: Orthopedics;  Laterality: Left;       Current Outpatient Medications  Medication Sig Dispense Refill Last Dose  . amitriptyline (ELAVIL) 50 MG tablet Take 1 tablet (50 mg total) by mouth at bedtime. (Patient taking differently: Take 50 mg by mouth at bedtime. 2 hours prior to bedtime.) 90 tablet 1 12/11/2016 at 2140  . butalbital-acetaminophen-caffeine (FIORICET, ESGIC) 50-325-40 MG tablet Take 1-2 tablets by mouth every 6 (six) hours as needed for migraine.   2 12/12/2016 at 0800  . Cetirizine HCl (KLS ALLER-TEC PO) Take 10 mg by mouth daily.    12/11/2016 at 2140  . estradiol (ESTRACE) 0.5 MG tablet Take 0.5 mg by mouth daily.  3   . FLUoxetine (PROZAC) 40 MG capsule Take 40 mg by mouth daily.   12/11/2016 at 2140  . LORazepam (ATIVAN) 0.5 MG tablet Take 0.5 mg by mouth 2 (two) times daily as needed for anxiety.     . Probiotic Product (PROBIOTIC PO) Take 1 capsule by mouth daily.     . Triamcinolone Acetonide (NASACORT AQ NA) Place 1 spray into the nose 2 (two) times daily as needed (for nasal congestion.).    12/12/2016 at 0800  . triamcinolone cream (KENALOG) 0.1 % Apply 1 application topically 2 (two) times daily as needed. For skin irritation.  2   . zolpidem (AMBIEN) 10 MG tablet Take 5-10 mg by mouth at  bedtime as needed for sleep.  0    No Known Allergies  Social History   Tobacco Use  . Smoking status: Never Smoker  . Smokeless tobacco: Never Used  Substance Use Topics  . Alcohol use: No    Family History  Problem Relation Age of Onset  . Prostate cancer Father   . Colon cancer Neg Hx   . Colon polyps Neg Hx      Review of Systems  Constitutional: Negative.   HENT: Negative.   Eyes: Negative.   Respiratory: Negative.   Cardiovascular: Negative.   Gastrointestinal: Negative.   Genitourinary:  Negative.   Musculoskeletal: Positive for joint pain and myalgias. Negative for back pain, falls and neck pain.  Skin: Negative.   Neurological: Negative.   Endo/Heme/Allergies: Negative.   Psychiatric/Behavioral: Negative.     Objective:  Physical Exam  Constitutional: She is oriented to person, place, and time. She appears well-developed and well-nourished. No distress.  HENT:  Head: Normocephalic and atraumatic.  Right Ear: External ear normal.  Left Ear: External ear normal.  Nose: Nose normal.  Mouth/Throat: Oropharynx is clear and moist.  Eyes: Conjunctivae are normal.  Neck: Normal range of motion. Neck supple.  Cardiovascular: Normal rate, regular rhythm, normal heart sounds and intact distal pulses.  No murmur heard. Respiratory: Effort normal and breath sounds normal. No respiratory distress. She has no wheezes.  GI: Soft. Bowel sounds are normal. She exhibits no distension. There is no tenderness.  Musculoskeletal:  Her LEFT hip can be flexed to 130, rotated in 30, out 40 and abducted 40 without discomfort. Her RIGHT hip can be flexed to 100 with minimal internal rotation about 20 of external rotation 20 of abduction. She has an antalgic gait pattern on the RIGHT.  Neurological: She is alert and oriented to person, place, and time. She has normal strength. No sensory deficit.  Skin: No rash noted. She is not diaphoretic. No erythema.  Psychiatric: She has a normal mood and affect. Her behavior is normal.    Vital Signs Ht: 5 ft 6 in  Wt: 150 lbs  BMI: 24.2  BP: 124/70 sitting L arm  Pulse: 72 bpm  Imaging Review Plain radiographs demonstrate severe degenerative joint disease of the right hip(s). The bone quality appears to be good for age and reported activity level.    Preoperative templating of the joint replacement has been completed, documented, and submitted to the Operating Room personnel in order to optimize intra-operative equipment  management.     Assessment/Plan:  End stage primary osteoarthritis, right hip(s)  The patient history, physical examination, clinical judgement of the provider and imaging studies are consistent with end stage degenerative joint disease of the right hip(s) and total hip arthroplasty is deemed medically necessary. The treatment options including medical management, injection therapy, arthroscopy and arthroplasty were discussed at length. The risks and benefits of total hip arthroplasty were presented and reviewed. The risks due to aseptic loosening, infection, stiffness, dislocation/subluxation,  thromboembolic complications and other imponderables were discussed.  The patient acknowledged the explanation, agreed to proceed with the plan and consent was signed. Patient is being admitted for inpatient treatment for surgery, pain control, PT, OT, prophylactic antibiotics, VTE prophylaxis, progressive ambulation and ADL's and discharge planning.The patient is planning to be discharged with HEP.  Therapy Plans: HEP Disposition: Home with family Planned DVT prophylaxis: aspirin 325mg  BID DME needed: none PCP: Dr. Other: TXA IV PVCs post op after left THA in November  Ardeen Jourdain, PA-C

## 2017-06-26 ENCOUNTER — Inpatient Hospital Stay (HOSPITAL_COMMUNITY): Payer: Medicare Other

## 2017-06-26 ENCOUNTER — Inpatient Hospital Stay (HOSPITAL_COMMUNITY): Payer: Medicare Other | Admitting: Anesthesiology

## 2017-06-26 ENCOUNTER — Encounter (HOSPITAL_COMMUNITY)
Admission: RE | Disposition: A | Discharge status: Home / Self Care | Payer: Self-pay | Source: Ambulatory Visit | Attending: Orthopedic Surgery

## 2017-06-26 ENCOUNTER — Inpatient Hospital Stay (HOSPITAL_COMMUNITY)
Admission: RE | Admit: 2017-06-26 | Discharge: 2017-06-28 | DRG: 470 | Disposition: A | Discharge status: Home / Self Care | Payer: Medicare Other | Source: Ambulatory Visit | Attending: Orthopedic Surgery | Admitting: Orthopedic Surgery

## 2017-06-26 ENCOUNTER — Other Ambulatory Visit: Payer: Self-pay

## 2017-06-26 ENCOUNTER — Encounter (HOSPITAL_COMMUNITY): Payer: Self-pay | Admitting: *Deleted

## 2017-06-26 DIAGNOSIS — Z87442 Personal history of urinary calculi: Secondary | ICD-10-CM | POA: Diagnosis not present

## 2017-06-26 DIAGNOSIS — Z8042 Family history of malignant neoplasm of prostate: Secondary | ICD-10-CM

## 2017-06-26 DIAGNOSIS — Z79899 Other long term (current) drug therapy: Secondary | ICD-10-CM | POA: Diagnosis not present

## 2017-06-26 DIAGNOSIS — M169 Osteoarthritis of hip, unspecified: Secondary | ICD-10-CM | POA: Diagnosis present

## 2017-06-26 DIAGNOSIS — M1611 Unilateral primary osteoarthritis, right hip: Principal | ICD-10-CM | POA: Diagnosis present

## 2017-06-26 DIAGNOSIS — Z419 Encounter for procedure for purposes other than remedying health state, unspecified: Secondary | ICD-10-CM

## 2017-06-26 DIAGNOSIS — Z7952 Long term (current) use of systemic steroids: Secondary | ICD-10-CM

## 2017-06-26 DIAGNOSIS — Z96642 Presence of left artificial hip joint: Secondary | ICD-10-CM | POA: Diagnosis present

## 2017-06-26 DIAGNOSIS — Z90711 Acquired absence of uterus with remaining cervical stump: Secondary | ICD-10-CM | POA: Diagnosis not present

## 2017-06-26 DIAGNOSIS — M069 Rheumatoid arthritis, unspecified: Secondary | ICD-10-CM | POA: Diagnosis present

## 2017-06-26 DIAGNOSIS — N3010 Interstitial cystitis (chronic) without hematuria: Secondary | ICD-10-CM | POA: Diagnosis present

## 2017-06-26 DIAGNOSIS — M25751 Osteophyte, right hip: Secondary | ICD-10-CM | POA: Diagnosis present

## 2017-06-26 DIAGNOSIS — Z96649 Presence of unspecified artificial hip joint: Secondary | ICD-10-CM

## 2017-06-26 HISTORY — PX: TOTAL HIP ARTHROPLASTY: SHX124

## 2017-06-26 LAB — TYPE AND SCREEN
ABO/RH(D): A POS
Antibody Screen: NEGATIVE

## 2017-06-26 LAB — APTT: aPTT: 27 seconds (ref 24–36)

## 2017-06-26 SURGERY — ARTHROPLASTY, HIP, TOTAL, ANTERIOR APPROACH
Anesthesia: Spinal | Site: Hip | Laterality: Right

## 2017-06-26 MED ORDER — BUPIVACAINE-EPINEPHRINE (PF) 0.25% -1:200000 IJ SOLN
INTRAMUSCULAR | Status: AC
  Filled 2017-06-26: qty 30

## 2017-06-26 MED ORDER — BISACODYL 10 MG RE SUPP: 10.0000 mg | Freq: Every day | RECTAL | Status: DC | PRN

## 2017-06-26 MED ORDER — FENTANYL CITRATE (PF) 100 MCG/2ML IJ SOLN
INTRAMUSCULAR | Status: AC
  Filled 2017-06-26: qty 2

## 2017-06-26 MED ORDER — ONDANSETRON HCL 4 MG/2ML IJ SOLN
INTRAMUSCULAR | Status: AC
  Filled 2017-06-26: qty 2

## 2017-06-26 MED ORDER — BUPIVACAINE-EPINEPHRINE (PF) 0.25% -1:200000 IJ SOLN
INTRAMUSCULAR | Status: DC | PRN
  Administered 2017-06-26: 30 mL

## 2017-06-26 MED ORDER — TRAMADOL HCL 50 MG PO TABS
50.0000 mg | ORAL_TABLET | Freq: Four times a day (QID) | ORAL | Status: DC | PRN
  Administered 2017-06-26: 100 mg via ORAL
  Filled 2017-06-26: qty 2

## 2017-06-26 MED ORDER — ONDANSETRON HCL 4 MG PO TABS: 4.0000 mg | ORAL_TABLET | Freq: Four times a day (QID) | ORAL | Status: DC | PRN

## 2017-06-26 MED ORDER — ACETAMINOPHEN 325 MG PO TABS: 325.0000 mg | ORAL_TABLET | Freq: Four times a day (QID) | ORAL | Status: DC | PRN

## 2017-06-26 MED ORDER — BUTALBITAL-APAP-CAFFEINE 50-325-40 MG PO TABS: 1.0000 | ORAL_TABLET | Freq: Four times a day (QID) | ORAL | Status: DC | PRN

## 2017-06-26 MED ORDER — PHENOL 1.4 % MT LIQD: 1.0000 | OROMUCOSAL | Status: DC | PRN

## 2017-06-26 MED ORDER — ONDANSETRON HCL 4 MG/2ML IJ SOLN: 4.0000 mg | Freq: Four times a day (QID) | INTRAMUSCULAR | Status: DC | PRN

## 2017-06-26 MED ORDER — METHOCARBAMOL 500 MG PO TABS
500.0000 mg | ORAL_TABLET | Freq: Four times a day (QID) | ORAL | Status: DC | PRN
Start: 2017-06-26 — End: 2017-06-28
  Administered 2017-06-26 – 2017-06-28 (×4): 500 mg via ORAL
  Filled 2017-06-26 (×6): qty 1

## 2017-06-26 MED ORDER — DOCUSATE SODIUM 100 MG PO CAPS
100.0000 mg | ORAL_CAPSULE | Freq: Two times a day (BID) | ORAL | Status: DC
  Administered 2017-06-26 – 2017-06-28 (×4): 100 mg via ORAL
  Filled 2017-06-26 (×4): qty 1

## 2017-06-26 MED ORDER — PHENYLEPHRINE 40 MCG/ML (10ML) SYRINGE FOR IV PUSH (FOR BLOOD PRESSURE SUPPORT)
PREFILLED_SYRINGE | INTRAVENOUS | Status: DC | PRN
  Administered 2017-06-26 (×5): 40 ug via INTRAVENOUS

## 2017-06-26 MED ORDER — CETIRIZINE HCL 10 MG PO TABS
10.0000 mg | ORAL_TABLET | Freq: Every day | ORAL | Status: DC
  Administered 2017-06-28: 10 mg via ORAL
  Filled 2017-06-26 (×2): qty 1

## 2017-06-26 MED ORDER — CEFAZOLIN SODIUM-DEXTROSE 1-4 GM/50ML-% IV SOLN
1.0000 g | Freq: Four times a day (QID) | INTRAVENOUS | Status: AC
  Administered 2017-06-26 (×2): 1 g via INTRAVENOUS
  Filled 2017-06-26 (×2): qty 50

## 2017-06-26 MED ORDER — ACETAMINOPHEN 10 MG/ML IV SOLN
1000.0000 mg | Freq: Four times a day (QID) | INTRAVENOUS | Status: DC
  Administered 2017-06-26: 1000 mg via INTRAVENOUS
  Filled 2017-06-26: qty 100

## 2017-06-26 MED ORDER — EPHEDRINE SULFATE 50 MG/ML IJ SOLN
INTRAMUSCULAR | Status: DC | PRN
  Administered 2017-06-26: 10 mg via INTRAVENOUS

## 2017-06-26 MED ORDER — LACTATED RINGERS IV SOLN
INTRAVENOUS | Status: DC
  Administered 2017-06-26 (×2): via INTRAVENOUS

## 2017-06-26 MED ORDER — DEXAMETHASONE SODIUM PHOSPHATE 10 MG/ML IJ SOLN
INTRAMUSCULAR | Status: AC
  Filled 2017-06-26: qty 1

## 2017-06-26 MED ORDER — CHLORHEXIDINE GLUCONATE 4 % EX LIQD: 60.0000 mL | Freq: Once | CUTANEOUS | Status: DC

## 2017-06-26 MED ORDER — BUPIVACAINE IN DEXTROSE 0.75-8.25 % IT SOLN
INTRATHECAL | Status: DC | PRN
  Administered 2017-06-26: 1.5 mL via INTRATHECAL

## 2017-06-26 MED ORDER — ACETAMINOPHEN 500 MG PO TABS
1000.0000 mg | ORAL_TABLET | Freq: Four times a day (QID) | ORAL | Status: AC
  Administered 2017-06-26 – 2017-06-27 (×4): 1000 mg via ORAL
  Filled 2017-06-26 (×5): qty 2

## 2017-06-26 MED ORDER — METOCLOPRAMIDE HCL 5 MG/ML IJ SOLN: 10.0000 mg | Freq: Once | INTRAMUSCULAR | Status: DC | PRN

## 2017-06-26 MED ORDER — ONDANSETRON HCL 4 MG/2ML IJ SOLN
INTRAMUSCULAR | Status: DC | PRN
  Administered 2017-06-26: 4 mg via INTRAVENOUS

## 2017-06-26 MED ORDER — MEPERIDINE HCL 50 MG/ML IJ SOLN: 6.2500 mg | INTRAMUSCULAR | Status: DC | PRN

## 2017-06-26 MED ORDER — LORAZEPAM 0.5 MG PO TABS: 0.5000 mg | ORAL_TABLET | Freq: Two times a day (BID) | ORAL | Status: DC | PRN

## 2017-06-26 MED ORDER — FLEET ENEMA 7-19 GM/118ML RE ENEM: 1.0000 | ENEMA | Freq: Once | RECTAL | Status: DC | PRN

## 2017-06-26 MED ORDER — PHENYLEPHRINE 40 MCG/ML (10ML) SYRINGE FOR IV PUSH (FOR BLOOD PRESSURE SUPPORT)
PREFILLED_SYRINGE | INTRAVENOUS | Status: AC
  Filled 2017-06-26: qty 10

## 2017-06-26 MED ORDER — TRANEXAMIC ACID 1000 MG/10ML IV SOLN
1000.0000 mg | Freq: Once | INTRAVENOUS | Status: AC
  Administered 2017-06-26: 1000 mg via INTRAVENOUS
  Filled 2017-06-26: qty 1100

## 2017-06-26 MED ORDER — POLYETHYLENE GLYCOL 3350 17 G PO PACK: 17.0000 g | PACK | Freq: Every day | ORAL | Status: DC | PRN

## 2017-06-26 MED ORDER — HYDROMORPHONE HCL 1 MG/ML IJ SOLN: 0.5000 mg | INTRAMUSCULAR | Status: DC | PRN

## 2017-06-26 MED ORDER — EPHEDRINE SULFATE 50 MG/ML IJ SOLN
INTRAMUSCULAR | Status: AC
  Filled 2017-06-26: qty 1

## 2017-06-26 MED ORDER — MIDAZOLAM HCL 2 MG/2ML IJ SOLN
INTRAMUSCULAR | Status: AC
  Filled 2017-06-26: qty 2

## 2017-06-26 MED ORDER — OXYCODONE HCL 5 MG PO TABS
10.0000 mg | ORAL_TABLET | ORAL | Status: DC | PRN
  Administered 2017-06-26 – 2017-06-28 (×8): 15 mg via ORAL
  Filled 2017-06-26 (×8): qty 3

## 2017-06-26 MED ORDER — ASPIRIN EC 325 MG PO TBEC
325.0000 mg | DELAYED_RELEASE_TABLET | Freq: Two times a day (BID) | ORAL | Status: DC
  Administered 2017-06-27 – 2017-06-28 (×3): 325 mg via ORAL
  Filled 2017-06-26 (×3): qty 1

## 2017-06-26 MED ORDER — 0.9 % SODIUM CHLORIDE (POUR BTL) OPTIME
TOPICAL | Status: DC | PRN
  Administered 2017-06-26: 1000 mL

## 2017-06-26 MED ORDER — METOCLOPRAMIDE HCL 5 MG PO TABS: 5.0000 mg | ORAL_TABLET | Freq: Three times a day (TID) | ORAL | Status: DC | PRN

## 2017-06-26 MED ORDER — DEXAMETHASONE SODIUM PHOSPHATE 10 MG/ML IJ SOLN
8.0000 mg | Freq: Once | INTRAMUSCULAR | Status: AC
  Administered 2017-06-26: 10 mg via INTRAVENOUS
  Filled 2017-06-26: qty 1

## 2017-06-26 MED ORDER — PROPOFOL 10 MG/ML IV BOLUS
INTRAVENOUS | Status: AC
  Filled 2017-06-26: qty 60

## 2017-06-26 MED ORDER — CEFAZOLIN SODIUM-DEXTROSE 2-4 GM/100ML-% IV SOLN
2.0000 g | INTRAVENOUS | Status: AC
  Administered 2017-06-26: 2 g via INTRAVENOUS
  Filled 2017-06-26: qty 100

## 2017-06-26 MED ORDER — DIPHENHYDRAMINE HCL 12.5 MG/5ML PO ELIX
12.5000 mg | ORAL_SOLUTION | ORAL | Status: DC | PRN
  Administered 2017-06-27: 12.5 mg via ORAL
  Administered 2017-06-27: 25 mg via ORAL
  Filled 2017-06-26: qty 10
  Filled 2017-06-26: qty 5

## 2017-06-26 MED ORDER — STERILE WATER FOR IRRIGATION IR SOLN
Status: DC | PRN
  Administered 2017-06-26: 2000 mL

## 2017-06-26 MED ORDER — FENTANYL CITRATE (PF) 100 MCG/2ML IJ SOLN
25.0000 ug | INTRAMUSCULAR | Status: DC | PRN
  Administered 2017-06-26: 50 ug via INTRAVENOUS
  Administered 2017-06-26 (×2): 25 ug via INTRAVENOUS
  Administered 2017-06-26 (×2): 50 ug via INTRAVENOUS

## 2017-06-26 MED ORDER — OXYCODONE HCL 5 MG PO TABS
5.0000 mg | ORAL_TABLET | ORAL | Status: DC | PRN
  Administered 2017-06-26: 10 mg via ORAL
  Administered 2017-06-26 (×2): 5 mg via ORAL
  Filled 2017-06-26: qty 1
  Filled 2017-06-26: qty 2
  Filled 2017-06-26: qty 1

## 2017-06-26 MED ORDER — AMITRIPTYLINE HCL 50 MG PO TABS
50.0000 mg | ORAL_TABLET | ORAL | Status: DC
  Administered 2017-06-26 – 2017-06-27 (×2): 50 mg via ORAL
  Filled 2017-06-26 (×3): qty 1

## 2017-06-26 MED ORDER — SODIUM CHLORIDE 0.9 % IV SOLN
INTRAVENOUS | Status: DC
  Administered 2017-06-26 (×2): via INTRAVENOUS

## 2017-06-26 MED ORDER — METOCLOPRAMIDE HCL 5 MG/ML IJ SOLN: 5.0000 mg | Freq: Three times a day (TID) | INTRAMUSCULAR | Status: DC | PRN

## 2017-06-26 MED ORDER — METHOCARBAMOL 1000 MG/10ML IJ SOLN
500.0000 mg | Freq: Four times a day (QID) | INTRAVENOUS | Status: DC | PRN
  Administered 2017-06-26: 500 mg via INTRAVENOUS
  Filled 2017-06-26: qty 550

## 2017-06-26 MED ORDER — PROPOFOL 500 MG/50ML IV EMUL
INTRAVENOUS | Status: DC | PRN
  Administered 2017-06-26: 50 ug/kg/min via INTRAVENOUS

## 2017-06-26 MED ORDER — FENTANYL CITRATE (PF) 100 MCG/2ML IJ SOLN
INTRAMUSCULAR | Status: DC | PRN
  Administered 2017-06-26 (×2): 50 ug via INTRAVENOUS

## 2017-06-26 MED ORDER — MIDAZOLAM HCL 2 MG/2ML IJ SOLN
INTRAMUSCULAR | Status: DC | PRN
  Administered 2017-06-26 (×2): 1 mg via INTRAVENOUS

## 2017-06-26 MED ORDER — DEXAMETHASONE SODIUM PHOSPHATE 10 MG/ML IJ SOLN
10.0000 mg | Freq: Once | INTRAMUSCULAR | Status: AC
  Administered 2017-06-27: 10 mg via INTRAVENOUS
  Filled 2017-06-26: qty 1

## 2017-06-26 MED ORDER — FLUOXETINE HCL 20 MG PO CAPS
40.0000 mg | ORAL_CAPSULE | Freq: Every day | ORAL | Status: DC
  Administered 2017-06-27 – 2017-06-28 (×2): 40 mg via ORAL
  Filled 2017-06-26 (×2): qty 2

## 2017-06-26 MED ORDER — ZOLPIDEM TARTRATE 5 MG PO TABS: 5.0000 mg | ORAL_TABLET | Freq: Every evening | ORAL | Status: DC | PRN

## 2017-06-26 MED ORDER — MENTHOL 3 MG MT LOZG: 1.0000 | LOZENGE | OROMUCOSAL | Status: DC | PRN

## 2017-06-26 SURGICAL SUPPLY — 44 items
BAG DECANTER FOR FLEXI CONT (MISCELLANEOUS) IMPLANT
BAG ZIPLOCK 12X15 (MISCELLANEOUS) IMPLANT
BLADE EXTENDED COATED 6.5IN (ELECTRODE) ×3 IMPLANT
BLADE SAG 18X100X1.27 (BLADE) ×3 IMPLANT
CAPT HIP TOTAL 2 ×3 IMPLANT
CLOSURE WOUND 1/2 X4 (GAUZE/BANDAGES/DRESSINGS) ×1
CLOTH BEACON ORANGE TIMEOUT ST (SAFETY) ×3 IMPLANT
COVER PERINEAL POST (MISCELLANEOUS) ×3 IMPLANT
COVER SURGICAL LIGHT HANDLE (MISCELLANEOUS) ×3 IMPLANT
DECANTER SPIKE VIAL GLASS SM (MISCELLANEOUS) ×3 IMPLANT
DRAPE STERI IOBAN 125X83 (DRAPES) ×3 IMPLANT
DRAPE U-SHAPE 47X51 STRL (DRAPES) ×6 IMPLANT
DRSG ADAPTIC 3X8 NADH LF (GAUZE/BANDAGES/DRESSINGS) ×3 IMPLANT
DRSG MEPILEX BORDER 4X4 (GAUZE/BANDAGES/DRESSINGS) ×3 IMPLANT
DRSG MEPILEX BORDER 4X8 (GAUZE/BANDAGES/DRESSINGS) ×3 IMPLANT
DURAPREP 26ML APPLICATOR (WOUND CARE) ×3 IMPLANT
ELECT REM PT RETURN 15FT ADLT (MISCELLANEOUS) ×3 IMPLANT
EVACUATOR 1/8 PVC DRAIN (DRAIN) ×3 IMPLANT
GLOVE BIO SURGEON STRL SZ8 (GLOVE) ×6 IMPLANT
GLOVE BIOGEL PI IND STRL 6.5 (GLOVE) IMPLANT
GLOVE BIOGEL PI IND STRL 7.0 (GLOVE) ×1 IMPLANT
GLOVE BIOGEL PI IND STRL 7.5 (GLOVE) ×4 IMPLANT
GLOVE BIOGEL PI IND STRL 8 (GLOVE) ×2 IMPLANT
GLOVE BIOGEL PI INDICATOR 6.5 (GLOVE)
GLOVE BIOGEL PI INDICATOR 7.0 (GLOVE) ×2
GLOVE BIOGEL PI INDICATOR 7.5 (GLOVE) ×8
GLOVE BIOGEL PI INDICATOR 8 (GLOVE) ×4
GLOVE SURG SS PI 6.5 STRL IVOR (GLOVE) IMPLANT
GOWN SPEC L3 XXLG W/TWL (GOWN DISPOSABLE) ×3 IMPLANT
GOWN SPEC L4 XLG W/TWL (GOWN DISPOSABLE) ×3 IMPLANT
GOWN STRL REUS W/TWL LRG LVL3 (GOWN DISPOSABLE) ×3 IMPLANT
GOWN STRL REUS W/TWL XL LVL3 (GOWN DISPOSABLE) ×3 IMPLANT
PACK ANTERIOR HIP CUSTOM (KITS) ×3 IMPLANT
STRIP CLOSURE SKIN 1/2X4 (GAUZE/BANDAGES/DRESSINGS) ×2 IMPLANT
SUT ETHIBOND NAB CT1 #1 30IN (SUTURE) ×3 IMPLANT
SUT MNCRL AB 4-0 PS2 18 (SUTURE) ×3 IMPLANT
SUT STRATAFIX 0 PDS 27 VIOLET (SUTURE) ×3
SUT VIC AB 2-0 CT1 27 (SUTURE) ×4
SUT VIC AB 2-0 CT1 TAPERPNT 27 (SUTURE) ×2 IMPLANT
SUTURE STRATFX 0 PDS 27 VIOLET (SUTURE) ×1 IMPLANT
SYR 50ML LL SCALE MARK (SYRINGE) IMPLANT
TRAY FOLEY CATH 14FRSI W/METER (CATHETERS) ×3 IMPLANT
TRAY FOLEY MTR SLVR 16FR STAT (SET/KITS/TRAYS/PACK) IMPLANT
YANKAUER SUCT BULB TIP 10FT TU (MISCELLANEOUS) ×3 IMPLANT

## 2017-06-26 NOTE — Evaluation (Signed)
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  Pt should progress to dc home with family assist.  This session, pt noted audible clunk in R hip while moving to EOB - no wt on R LE at the time and pt able to continue with session but states increased pain is where she felt clunk - RN advised.    Follow Up Recommendations Follow surgeon's recommendation for DC plan and follow-up therapies    Equipment Recommendations  None recommended by PT    Recommendations for Other Services       Precautions / Restrictions Precautions Precautions: Fall Restrictions Weight Bearing Restrictions: No Other Position/Activity Restrictions: WBAT      Mobility  Bed Mobility Overal bed mobility: Needs Assistance Bed Mobility: Supine to Sit     Supine to sit: Min assist     General bed mobility comments: cues for sequence and use of L LE to self assist  Transfers Overall transfer level: Needs assistance Equipment used: Rolling walker (2 wheeled) Transfers: Sit to/from Stand Sit to Stand: Min assist         General transfer comment: cues for LE management and use of UEs to self assist  Ambulation/Gait Ambulation/Gait assistance: Min assist Ambulation Distance (Feet): 10 Feet Assistive device: Rolling walker (2 wheeled) Gait Pattern/deviations: Step-to pattern;Decreased step length - right;Decreased step length - left;Shuffle Gait velocity: decr   General Gait Details: cues for sequence, posture and position from RW - distance ltd by pain  Stairs            Wheelchair Mobility    Modified Rankin (Stroke Patients Only)       Balance                                              Pertinent Vitals/Pain Pain Assessment: 0-10 Pain Score: 8  Pain Location: R hip Pain Descriptors / Indicators: Aching;Sore Pain Intervention(s): Limited activity within patient's tolerance;Monitored during session;Premedicated before session;Ice applied    Home Living Family/patient expects to be discharged to:: Private residence Living Arrangements: Spouse/significant other Available Help at Discharge: Family Type of Home: House Home Access: Stairs to enter   Secretary/administrator of Steps: 1 Home Layout: One level Home Equipment: Shower seat - built in      Prior Function Level of Independence: Independent               Higher education careers adviser        Extremity/Trunk Assessment   Upper Extremity Assessment Upper Extremity Assessment: Overall WFL for tasks assessed    Lower Extremity Assessment Lower Extremity Assessment: RLE deficits/detail    Cervical / Trunk Assessment Cervical / Trunk Assessment: Normal  Communication   Communication: No difficulties  Cognition Arousal/Alertness: Awake/alert Behavior During Therapy: WFL for tasks assessed/performed Overall Cognitive Status: Within Functional Limits for tasks assessed                                        General Comments  Exercises Total Joint Exercises Ankle Circles/Pumps: AROM;Both;15 reps;Supine   Assessment/Plan    PT Assessment Patient needs continued PT services  PT Problem List Decreased strength;Decreased range of motion;Decreased activity tolerance;Decreased mobility;Pain;Decreased knowledge of use of DME       PT Treatment Interventions DME instruction;Gait training;Stair training;Functional mobility training;Therapeutic activities;Therapeutic exercise;Patient/family education    PT Goals (Current goals can be found in the Care Plan section)  Acute Rehab PT Goals Patient Stated Goal: Regain IND PT Goal Formulation: With patient Time For Goal Achievement:  07/03/17 Potential to Achieve Goals: Good    Frequency 7X/week   Barriers to discharge        Co-evaluation               AM-PAC PT "6 Clicks" Daily Activity  Outcome Measure Difficulty turning over in bed (including adjusting bedclothes, sheets and blankets)?: Unable Difficulty moving from lying on back to sitting on the side of the bed? : Unable Difficulty sitting down on and standing up from a chair with arms (e.g., wheelchair, bedside commode, etc,.)?: Unable Help needed moving to and from a bed to chair (including a wheelchair)?: A Little Help needed walking in hospital room?: A Little Help needed climbing 3-5 steps with a railing? : A Little 6 Click Score: 12    End of Session Equipment Utilized During Treatment: Gait belt Activity Tolerance: Patient limited by pain;Patient tolerated treatment well Patient left: in chair;with call bell/phone within reach;with chair alarm set;with family/visitor present Nurse Communication: Mobility status PT Visit Diagnosis: Difficulty in walking, not elsewhere classified (R26.2)    Time: 9924-2683 PT Time Calculation (min) (ACUTE ONLY): 30 min   Charges:   PT Evaluation $PT Eval Low Complexity: 1 Low PT Treatments $Gait Training: 8-22 mins   PT G Codes:        Pg [phone]   Ashleyann Shoun 06/26/2017, 6:15 PM

## 2017-06-26 NOTE — Discharge Instructions (Signed)
Dr. Ollen Gross Total Joint Specialist [facility] [address], [address] [place], [place] [address] [phone]  ANTERIOR APPROACH TOTAL HIP REPLACEMENT POSTOPERATIVE DIRECTIONS   Hip Rehabilitation, Guidelines Following Surgery  The results of a hip operation are greatly improved after range of motion and muscle strengthening exercises. Follow all safety measures which are given to protect your hip. If any of these exercises cause increased pain or swelling in your joint, decrease the amount until you are comfortable again. Then slowly increase the exercises. Call your caregiver if you have problems or questions.   BLOOD CLOT PREVENTION: Take one tablet (325 mg) Aspirin two times a day for three weeks following surgery. Then take one baby Aspirin (81 mg) once a day for three weeks. Then discontinue aspirin.   HOME CARE INSTRUCTIONS  Remove items at home which could result in a fall. This includes throw rugs or furniture in walking pathways.   ICE to the affected hip every three hours for 30 minutes at a time and then as needed for pain and swelling.  Continue to use ice on the hip for pain and swelling from surgery. You may notice swelling that will progress down to the foot and ankle.  This is normal after surgery.  Elevate the leg when you are not up walking on it.    Continue to use the breathing machine which will help keep your temperature down.  It is common for your temperature to cycle up and down following surgery, especially at night when you are not up moving around and exerting yourself.  The breathing machine keeps your lungs expanded and your temperature down.   DIET You may resume your previous home diet once your are discharged from the hospital.  DRESSING / WOUND CARE / SHOWERING You may shower 3 days after surgery, but keep the wounds dry during showering.  You may use an occlusive plastic wrap (Press'n Seal for example), NO SOAKING/SUBMERGING IN THE  BATHTUB.  If the bandage gets wet, change with a clean dry gauze.  If the incision gets wet, pat the wound dry with a clean towel. You may start showering once you are discharged home but do not submerge the incision under water. Just pat the incision dry and apply a dry gauze dressing on daily. Change the surgical dressing daily and reapply a dry dressing each time.  ACTIVITY Walk with your walker as instructed. Use walker as long as suggested by your caregivers. Avoid periods of inactivity such as sitting longer than an hour when not asleep. This helps prevent blood clots.  You may resume a sexual relationship in one month or when given the OK by your doctor.  You may return to work once you are cleared by your doctor.  Do not drive a car for 6 weeks or until released by you surgeon.  Do not drive while taking narcotics.  WEIGHT BEARING Weight bearing as tolerated with assist device (walker, cane, etc) as directed, use it as long as suggested by your surgeon or therapist, typically at least 4-6 weeks.  POSTOPERATIVE CONSTIPATION PROTOCOL Constipation - defined medically as fewer than three stools per week and severe constipation as less than one stool per week.  One of the most common issues patients have following surgery is constipation.  Even if you have a regular bowel pattern at home, your normal regimen is likely to be disrupted due to multiple reasons following surgery.  Combination of anesthesia, postoperative narcotics, change in appetite and  fluid intake all can affect your bowels.  In order to avoid complications following surgery, here are some recommendations in order to help you during your recovery period.  Colace (docusate) - Pick up an over-the-counter form of Colace or another stool softener and take twice a day as long as you are requiring postoperative pain medications.  Take with a full glass of water daily.  If you experience loose stools or diarrhea, hold the colace  until you stool forms back up.  If your symptoms do not get better within 1 week or if they get worse, check with your doctor.  Dulcolax (bisacodyl) - Pick up over-the-counter and take as directed by the product packaging as needed to assist with the movement of your bowels.  Take with a full glass of water.  Use this product as needed if not relieved by Colace only.   MiraLax (polyethylene glycol) - Pick up over-the-counter to have on hand.  MiraLax is a solution that will increase the amount of water in your bowels to assist with bowel movements.  Take as directed and can mix with a glass of water, juice, soda, coffee, or tea.  Take if you go more than two days without a movement. Do not use MiraLax more than once per day. Call your doctor if you are still constipated or irregular after using this medication for 7 days in a row.  If you continue to have problems with postoperative constipation, please contact the office for further assistance and recommendations.  If you experience "the worst abdominal pain ever" or develop nausea or vomiting, please contact the office immediatly for further recommendations for treatment.  ITCHING  If you experience itching with your medications, try taking only a single pain pill, or even half a pain pill at a time.  You can also use Benadryl over the counter for itching or also to help with sleep.   TED HOSE STOCKINGS Wear the elastic stockings on both legs for three weeks following surgery during the day but you may remove then at night for sleeping.  MEDICATIONS See your medication summary on the After Visit Summary that the nursing staff will review with you prior to discharge.  You may have some home medications which will be placed on hold until you complete the course of blood thinner medication.  It is important for you to complete the blood thinner medication as prescribed by your surgeon.  Continue your approved medications as instructed at time of  discharge.  PRECAUTIONS If you experience chest pain or shortness of breath - call 911 immediately for transfer to the hospital emergency department.  If you develop a fever greater that 101 F, purulent drainage from wound, increased redness or drainage from wound, foul odor from the wound/dressing, or calf pain - CONTACT YOUR SURGEON.                                                   FOLLOW-UP APPOINTMENTS Make sure you keep all of your appointments after your operation with your surgeon and caregivers. You should call the office at the above phone number and make an appointment for approximately two weeks after the date of your surgery or on the date instructed by your surgeon outlined in the "After Visit Summary".  RANGE OF MOTION AND STRENGTHENING EXERCISES  These exercises are designed  to help you keep full movement of your hip joint. Follow your caregiver's or physical therapist's instructions. Perform all exercises about fifteen times, three times per day or as directed. Exercise both hips, even if you have had only one joint replacement. These exercises can be done on a training (exercise) mat, on the floor, on a table or on a bed. Use whatever works the best and is most comfortable for you. Use music or television while you are exercising so that the exercises are a pleasant break in your day. This will make your life better with the exercises acting as a break in routine you can look forward to.  Lying on your back, slowly slide your foot toward your buttocks, raising your knee up off the floor. Then slowly slide your foot back down until your leg is straight again.  Lying on your back spread your legs as far apart as you can without causing discomfort.  Lying on your side, raise your upper leg and foot straight up from the floor as far as is comfortable. Slowly lower the leg and repeat.  Lying on your back, tighten up the muscle in the front of your thigh (quadriceps muscles). You can do  this by keeping your leg straight and trying to raise your heel off the floor. This helps strengthen the largest muscle supporting your knee.  Lying on your back, tighten up the muscles of your buttocks both with the legs straight and with the knee bent at a comfortable angle while keeping your heel on the floor.   IF YOU ARE TRANSFERRED TO A SKILLED REHAB FACILITY If the patient is transferred to a skilled rehab facility following release from the hospital, a list of the current medications will be sent to the facility for the patient to continue.  When discharged from the skilled rehab facility, please have the facility set up the patient's Home Health Physical Therapy prior to being released. Also, the skilled facility will be responsible for providing the patient with their medications at time of release from the facility to include their pain medication, the muscle relaxants, and their blood thinner medication. If the patient is still at the rehab facility at time of the two week follow up appointment, the skilled rehab facility will also need to assist the patient in arranging follow up appointment in our office and any transportation needs.  MAKE SURE YOU:  Understand these instructions.  Get help right away if you are not doing well or get worse.    Pick up stool softner and laxative for home use following surgery while on pain medications. Do not submerge incision under water. Please use good hand washing techniques while changing dressing each day. May shower starting three days after surgery. Please use a clean towel to pat the incision dry following showers. Continue to use ice for pain and swelling after surgery. Do not use any lotions or creams on the incision until instructed by your surgeon.

## 2017-06-26 NOTE — Anesthesia Preprocedure Evaluation (Signed)
Anesthesia Evaluation  Patient identified by MRN, date of birth, ID band Patient awake    Reviewed: Allergy & Precautions, NPO status , Patient's Chart, lab work & pertinent test results  History of Anesthesia Complications (+) PONV  Airway Mallampati: II  TM Distance: >3 FB Neck ROM: Full    Dental no notable dental hx. (+) Dental Advisory Given, Teeth Intact   Pulmonary neg pulmonary ROS,    Pulmonary exam normal breath sounds clear to auscultation       Cardiovascular negative cardio ROS Normal cardiovascular exam Rhythm:Regular Rate:Normal     Neuro/Psych negative neurological ROS  negative psych ROS   GI/Hepatic negative GI ROS, Neg liver ROS,   Endo/Other  negative endocrine ROS  Renal/GU Nephrolithiasis  negative genitourinary   Musculoskeletal negative musculoskeletal ROS (+) Arthritis , Osteoarthritis and Rheumatoid disorders,    Abdominal   Peds  Hematology negative hematology ROS (+)   Anesthesia Other Findings   Reproductive/Obstetrics                             Anesthesia Physical  Anesthesia Plan  ASA: II  Anesthesia Plan: Spinal   Post-op Pain Management:    Induction:   PONV Risk Score and Plan: 3 and Treatment may vary due to age or medical condition and Propofol infusion  Airway Management Planned: Simple Face Mask  Additional Equipment: None  Intra-op Plan:   Post-operative Plan:   Informed Consent: I have reviewed the patients History and Physical, chart, labs and discussed the procedure including the risks, benefits and alternatives for the proposed anesthesia with the patient or authorized representative who has indicated his/her understanding and acceptance.   Dental advisory given  Plan Discussed with: CRNA  Anesthesia Plan Comments:         Anesthesia Quick Evaluation

## 2017-06-26 NOTE — Interval H&P Note (Signed)
History and Physical Interval Note:  06/26/2017 6:27 AM  Brandi Hines  has presented today for surgery, with the diagnosis of Osteoarthritis Right Hip  The various methods of treatment have been discussed with the patient and family. After consideration of risks, benefits and other options for treatment, the patient has consented to  Procedure(s): RIGHT TOTAL HIP ARTHROPLASTY ANTERIOR APPROACH (Right) as a surgical intervention .  The patient's history has been reviewed, patient examined, no change in status, stable for surgery.  I have reviewed the patient's chart and labs.  Questions were answered to the patient's satisfaction.     Homero Fellers [name]

## 2017-06-26 NOTE — Anesthesia Postprocedure Evaluation (Signed)
Anesthesia Post Note  Patient: Brandi Hines  Procedure(s) Performed: RIGHT TOTAL HIP ARTHROPLASTY ANTERIOR APPROACH (Right Hip)     Patient location during evaluation: PACU Anesthesia Type: Spinal Level of consciousness: awake and alert Pain management: pain level controlled Vital Signs Assessment: post-procedure vital signs reviewed and stable Respiratory status: spontaneous breathing and respiratory function stable Cardiovascular status: blood pressure returned to baseline and stable Postop Assessment: no headache, no backache, spinal receding and no apparent nausea or vomiting Anesthetic complications: no    Last Vitals:  Vitals:   06/26/17 1245 06/26/17 1340  BP: 119/76 124/75  Pulse: 66 69  Resp: 15 16  Temp: 36.6 C 36.7 C  SpO2: 100% 99%    Last Pain:  Vitals:   06/26/17 1340  TempSrc: Oral  PainSc:                  Phillips Grout

## 2017-06-26 NOTE — Anesthesia Procedure Notes (Signed)
Date/Time: 06/26/2017 7:19 AM Performed by: Delphia Grates, CRNA Pre-anesthesia Checklist: Patient identified, Emergency Drugs available, Suction available and Patient being monitored Patient Re-evaluated:Patient Re-evaluated prior to induction Oxygen Delivery Method: Simple face mask Placement Confirmation: positive ETCO2

## 2017-06-26 NOTE — Anesthesia Procedure Notes (Signed)
Spinal  Patient location during procedure: OR Staffing Anesthesiologist: Damian Buckles, MD Performed: anesthesiologist  Preanesthetic Checklist Completed: patient identified, site marked, surgical consent, pre-op evaluation, timeout performed, IV checked, risks and benefits discussed and monitors and equipment checked Spinal Block Patient position: sitting Prep: DuraPrep Patient monitoring: heart rate, continuous pulse ox and blood pressure Approach: right paramedian Location: L3-4 Injection technique: single-shot Needle Needle type: Sprotte  Needle gauge: 24 G Needle length: 9 cm Additional Notes Expiration date of kit checked and confirmed. Patient tolerated procedure well, without complications.       

## 2017-06-26 NOTE — Transfer of Care (Signed)
Immediate Anesthesia Transfer of Care Note  Patient: Brandi Hines  Procedure(s) Performed: RIGHT TOTAL HIP ARTHROPLASTY ANTERIOR APPROACH (Right Hip)  Patient Location: PACU  Anesthesia Type:MAC and Spinal  Level of Consciousness: awake, alert , oriented and patient cooperative  Airway & Oxygen Therapy: Patient Spontanous Breathing and Patient connected to face mask oxygen  Post-op Assessment: Report given to RN and Post -op Vital signs reviewed and stable  Post vital signs: Reviewed and stable  Last Vitals:  Vitals Value Taken Time  BP 90/55 06/26/2017  8:42 AM  Temp    Pulse 71 06/26/2017  8:43 AM  Resp 16 06/26/2017  8:43 AM  SpO2 100 % 06/26/2017  8:43 AM  Vitals shown include unvalidated device data.  Last Pain:  Vitals:   06/26/17 0608  TempSrc:   PainSc: 0-No pain         Complications: No apparent anesthesia complications

## 2017-06-26 NOTE — Op Note (Signed)
OPERATIVE REPORT- TOTAL HIP ARTHROPLASTY   PREOPERATIVE DIAGNOSIS: Osteoarthritis of the Right hip.   POSTOPERATIVE DIAGNOSIS: Osteoarthritis of the Right  hip.   PROCEDURE: Right total hip arthroplasty, anterior approach.   SURGEON: Ollen Gross, MD   ASSISTANT: Leilani Able, PA-C  ANESTHESIA:  Spinal  ESTIMATED BLOOD LOSS:-450 mL    DRAINS: Hemovac x1.   COMPLICATIONS: None   CONDITION: PACU - hemodynamically stable.   BRIEF CLINICAL NOTE: Brandi Hines is a 67 y.o. female who has advanced end-  stage arthritis of their Right  hip with progressively worsening pain and  dysfunction.The patient has failed nonoperative management and presents for  total hip arthroplasty.   PROCEDURE IN DETAIL: After successful administration of spinal  anesthetic, the traction boots for the [facility] bed were placed on both  feet and the patient was placed onto the [facility] bed, boots placed into the leg  holders. The Right hip was then isolated from the perineum with plastic  drapes and prepped and draped in the usual sterile fashion. ASIS and  greater trochanter were marked and a oblique incision was made, starting  at about 1 cm lateral and 2 cm distal to the ASIS and coursing towards  the anterior cortex of the femur. The skin was cut with a 10 blade  through subcutaneous tissue to the level of the fascia overlying the  tensor fascia lata muscle. The fascia was then incised in line with the  incision at the junction of the anterior third and posterior 2/3rd. The  muscle was teased off the fascia and then the interval between the TFL  and the rectus was developed. The Hohmann retractor was then placed at  the top of the femoral neck over the capsule. The vessels overlying the  capsule were cauterized and the fat on top of the capsule was removed.  A Hohmann retractor was then placed anterior underneath the rectus  femoris to give exposure to the entire anterior capsule. A T-shaped   capsulotomy was performed. The edges were tagged and the femoral head  was identified.       Osteophytes are removed off the superior acetabulum.  The femoral neck was then cut in situ with an oscillating saw. Traction  was then applied to the left lower extremity utilizing the [facility]  traction. The femoral head was then removed. Retractors were placed  around the acetabulum and then circumferential removal of the labrum was  performed. Osteophytes were also removed. Reaming starts at 45 mm to  medialize and  Increased in 2 mm increments to 47 mm. We reamed in  approximately 40 degrees of abduction, 20 degrees anteversion. A 48 mm  pinnacle acetabular shell was then impacted in anatomic position under  fluoroscopic guidance with excellent purchase. We did not need to place  any additional dome screws. A 28 mm neutral + 4 marathon liner was then  placed into the acetabular shell.       The femoral lift was then placed along the lateral aspect of the femur  just distal to the vastus ridge. The leg was  externally rotated and capsule  was stripped off the inferior aspect of the femoral neck down to the  level of the lesser trochanter, this was done with electrocautery. The femur was lifted after this was performed. The  leg was then placed in an extended and adducted position essentially delivering the femur. We also removed the capsule superiorly and the piriformis from the piriformis  fossa to gain excellent exposure of the  proximal femur. Rongeur was used to remove some cancellous bone to get  into the lateral portion of the proximal femur for placement of the  initial starter reamer. The starter broaches was placed  the starter broach  and was shown to go down the center of the canal. Broaching  with the  Corail system was then performed starting at size 8, coursing  Up to size 12. A size 12 had excellent torsional and rotational  and axial stability. The trial standard offset neck was then  placed  with a 28 + 1.5 trial head. The hip was then reduced. We confirmed that  the stem was in the canal both on AP and lateral x-rays. It also has excellent sizing. The hip was reduced with outstanding stability through full extension and full external rotation.. AP pelvis was taken and the leg lengths were measured and found to be equal. Hip was then dislocated again and the femoral head and neck removed. The  femoral broach was removed. Size 12 Corail stem with a standard offset  neck was then impacted into the femur following native anteversion. Has  excellent purchase in the canal. Excellent torsional and rotational and  axial stability. It is confirmed to be in the canal on AP and lateral  fluoroscopic views. The 28 + 1.5 ceramic head was placed and the hip  reduced with outstanding stability. Again AP pelvis was taken and it  confirmed that the leg lengths were equal. The wound was then copiously  irrigated with saline solution and the capsule reattached and repaired  with Ethibond suture. 30 ml of .25% Bupivicaine was  injected into the capsule and into the edge of the tensor fascia lata as well as subcutaneous tissue. The fascia overlying the tensor fascia lata was then closed with a running #1 V-Loc. Subcu was closed with interrupted 2-0 Vicryl and subcuticular running 4-0 Monocryl. Incision was cleaned  and dried. Steri-Strips and a bulky sterile dressing applied. Hemovac  drain was hooked to suction and then the patient was awakened and transported to  recovery in stable condition.        Please note that a surgical assistant was a medical necessity for this procedure to perform it in a safe and expeditious manner. Assistant was necessary to provide appropriate retraction of vital neurovascular structures and to prevent femoral fracture and allow for anatomic placement of the prosthesis.  Gaynelle Arabian, M.D.

## 2017-06-26 NOTE — Plan of Care (Signed)
Plan of care 

## 2017-06-27 LAB — CBC
HCT: 30.8 % — ABNORMAL LOW (ref 36.0–46.0)
HEMOGLOBIN: 9.8 g/dL — AB (ref 12.0–15.0)
MCH: 26.9 pg (ref 26.0–34.0)
MCHC: 31.8 g/dL (ref 30.0–36.0)
MCV: 84.6 fL (ref 78.0–100.0)
PLATELETS: 286 10*3/uL (ref 150–400)
RBC: 3.64 MIL/uL — AB (ref 3.87–5.11)
RDW: 15.6 % — ABNORMAL HIGH (ref 11.5–15.5)
WBC: 9 10*3/uL (ref 4.0–10.5)

## 2017-06-27 LAB — BASIC METABOLIC PANEL
Anion gap: 8 (ref 5–15)
BUN: 8 mg/dL (ref 6–20)
CHLORIDE: 108 mmol/L (ref 101–111)
CO2: 24 mmol/L (ref 22–32)
CREATININE: 0.74 mg/dL (ref 0.44–1.00)
Calcium: 8.4 mg/dL — ABNORMAL LOW (ref 8.9–10.3)
Glucose, Bld: 97 mg/dL (ref 65–99)
POTASSIUM: 4.6 mmol/L (ref 3.5–5.1)
Sodium: 140 mmol/L (ref 135–145)

## 2017-06-27 MED ORDER — METHOCARBAMOL 500 MG PO TABS: 500.0000 mg | ORAL_TABLET | Freq: Four times a day (QID) | ORAL | 0 refills | Status: DC | PRN

## 2017-06-27 MED ORDER — TRAMADOL HCL 50 MG PO TABS: 50.0000 mg | ORAL_TABLET | Freq: Four times a day (QID) | ORAL | 0 refills | Status: DC | PRN

## 2017-06-27 MED ORDER — ASPIRIN 325 MG PO TBEC: 325.0000 mg | DELAYED_RELEASE_TABLET | Freq: Two times a day (BID) | ORAL | 0 refills | Status: DC

## 2017-06-27 MED ORDER — OXYCODONE HCL 5 MG PO TABS: 5.0000 mg | ORAL_TABLET | Freq: Four times a day (QID) | ORAL | 0 refills | Status: DC | PRN

## 2017-06-27 NOTE — Progress Notes (Signed)
Physical Therapy Treatment Patient Details Name: Brandi Hines MRN: [id number] DOB: 1950/09/02 Today's Date: 06/27/2017    History of Present Illness Pt s/p R THR and with hx of L THR 1/19    PT Comments    Pt continues motivated and progressing steadily with mobility despite elevated pain level.  Pt hopeful for dc tomorrow.   Follow Up Recommendations  Follow surgeon's recommendation for DC plan and follow-up therapies     Equipment Recommendations  None recommended by PT    Recommendations for Other Services       Precautions / Restrictions Precautions Precautions: Fall Restrictions Weight Bearing Restrictions: No Other Position/Activity Restrictions: WBAT    Mobility  Bed Mobility Overal bed mobility: Needs Assistance Bed Mobility: Supine to Sit;Sit to Supine     Supine to sit: Min assist Sit to supine: Min assist   General bed mobility comments: cues for sequence and use of L LE to self assist  Transfers Overall transfer level: Needs assistance Equipment used: Rolling walker (2 wheeled) Transfers: Sit to/from Stand Sit to Stand: Min guard         General transfer comment: cues for LE management and use of UEs to self assist  Ambulation/Gait Ambulation/Gait assistance: Min assist;Min guard Ambulation Distance (Feet): 85 Feet(and 15' into bathroom) Assistive device: Rolling walker (2 wheeled) Gait Pattern/deviations: Step-to pattern;Decreased step length - right;Decreased step length - left;Shuffle Gait velocity: decr   General Gait Details: cues for sequence, posture and position from RW - distance ltd by pain   Stairs             Wheelchair Mobility    Modified Rankin (Stroke Patients Only)       Balance                                            Cognition Arousal/Alertness: Awake/alert Behavior During Therapy: WFL for tasks assessed/performed Overall Cognitive Status: Within Functional Limits for tasks  assessed                                        Exercises Total Joint Exercises Ankle Circles/Pumps: AROM;Both;15 reps;Supine Quad Sets: AROM;Both;10 reps;Supine Heel Slides: AAROM;Right;20 reps;Supine Hip ABduction/ADduction: AAROM;Right;15 reps;Supine    General Comments        Pertinent Vitals/Pain Pain Assessment: 0-10 Pain Score: 7  Pain Location: R hip Pain Descriptors / Indicators: Aching;Sore Pain Intervention(s): Limited activity within patient's tolerance;Monitored during session;Premedicated before session;Ice applied    Home Living                      Prior Function            PT Goals (current goals can now be found in the care plan section) Acute Rehab PT Goals Patient Stated Goal: Regain IND PT Goal Formulation: With patient Time For Goal Achievement: 07/03/17 Potential to Achieve Goals: Good Progress towards PT goals: Progressing toward goals    Frequency    7X/week      PT Plan Current plan remains appropriate    Co-evaluation              AM-PAC PT "6 Clicks" Daily Activity  Outcome Measure  Difficulty turning over in bed (including adjusting bedclothes, sheets and blankets)?: Unable Difficulty  moving from lying on back to sitting on the side of the bed? : Unable Difficulty sitting down on and standing up from a chair with arms (e.g., wheelchair, bedside commode, etc,.)?: Unable Help needed moving to and from a bed to chair (including a wheelchair)?: A Little Help needed walking in hospital room?: A Little Help needed climbing 3-5 steps with a railing? : A Little 6 Click Score: 12    End of Session Equipment Utilized During Treatment: Gait belt Activity Tolerance: Patient limited by pain;Patient tolerated treatment well Patient left: in bed;with call bell/phone within reach;with family/visitor present Nurse Communication: Mobility status PT Visit Diagnosis: Difficulty in walking, not elsewhere classified  (R26.2)     Time: 1330-1404 PT Time Calculation (min) (ACUTE ONLY): 34 min  Charges:  $Gait Training: 23-37 mins $Therapeutic Exercise: 8-22 mins                    G Codes:       Pg [phone]    Kasra Melvin 06/27/2017, 2:09 PM

## 2017-06-27 NOTE — Progress Notes (Signed)
Physical Therapy Treatment Patient Details Name: Brandi Hines MRN: [id number] DOB: March 17, 1950 Today's Date: 06/27/2017    History of Present Illness Pt s/p R THR and with hx of L THR 1/19    PT Comments    Pt cooperative but progressing slowly with mobility 2* pain level.     Follow Up Recommendations  Follow surgeon's recommendation for DC plan and follow-up therapies     Equipment Recommendations  None recommended by PT    Recommendations for Other Services       Precautions / Restrictions Precautions Precautions: Fall Restrictions Weight Bearing Restrictions: No Other Position/Activity Restrictions: WBAT    Mobility  Bed Mobility Overal bed mobility: Needs Assistance Bed Mobility: Supine to Sit     Supine to sit: Min assist     General bed mobility comments: cues for sequence and use of L LE to self assist  Transfers Overall transfer level: Needs assistance Equipment used: Rolling walker (2 wheeled) Transfers: Sit to/from Stand Sit to Stand: Min assist         General transfer comment: cues for LE management and use of UEs to self assist  Ambulation/Gait Ambulation/Gait assistance: Min assist Ambulation Distance (Feet): 58 Feet Assistive device: Rolling walker (2 wheeled) Gait Pattern/deviations: Step-to pattern;Decreased step length - right;Decreased step length - left;Shuffle Gait velocity: decr   General Gait Details: cues for sequence, posture and position from RW - distance ltd by pain   Stairs             Wheelchair Mobility    Modified Rankin (Stroke Patients Only)       Balance                                            Cognition Arousal/Alertness: Awake/alert Behavior During Therapy: WFL for tasks assessed/performed Overall Cognitive Status: Within Functional Limits for tasks assessed                                        Exercises Total Joint Exercises Ankle Circles/Pumps:  AROM;Both;15 reps;Supine Quad Sets: AROM;Both;10 reps;Supine Heel Slides: AAROM;Right;20 reps;Supine Hip ABduction/ADduction: AAROM;Right;15 reps;Supine    General Comments        Pertinent Vitals/Pain Pain Assessment: 0-10 Pain Score: 7  Pain Location: R hip Pain Descriptors / Indicators: Aching;Sore Pain Intervention(s): Limited activity within patient's tolerance;Monitored during session;Premedicated before session;Ice applied    Home Living                      Prior Function            PT Goals (current goals can now be found in the care plan section) Acute Rehab PT Goals Patient Stated Goal: Regain IND PT Goal Formulation: With patient Time For Goal Achievement: 07/03/17 Potential to Achieve Goals: Good Progress towards PT goals: Progressing toward goals    Frequency    7X/week      PT Plan Current plan remains appropriate    Co-evaluation              AM-PAC PT "6 Clicks" Daily Activity  Outcome Measure  Difficulty turning over in bed (including adjusting bedclothes, sheets and blankets)?: Unable Difficulty moving from lying on back to sitting on the side of the bed? : Unable  Difficulty sitting down on and standing up from a chair with arms (e.g., wheelchair, bedside commode, etc,.)?: Unable Help needed moving to and from a bed to chair (including a wheelchair)?: A Little Help needed walking in hospital room?: A Little Help needed climbing 3-5 steps with a railing? : A Lot 6 Click Score: 11    End of Session Equipment Utilized During Treatment: Gait belt Activity Tolerance: Patient limited by pain;Patient tolerated treatment well Patient left: in chair;with call bell/phone within reach;with chair alarm set;with family/visitor present Nurse Communication: Mobility status PT Visit Diagnosis: Difficulty in walking, not elsewhere classified (R26.2)     Time: 7616-0737 PT Time Calculation (min) (ACUTE ONLY): 30 min  Charges:  $Gait  Training: 8-22 mins $Therapeutic Exercise: 8-22 mins                    G Codes:       Pg [phone]    Brandi Hines 06/27/2017, 12:41 PM

## 2017-06-27 NOTE — Progress Notes (Signed)
   Subjective: 1 Day Post-Op Procedure(s) (LRB): RIGHT TOTAL HIP ARTHROPLASTY ANTERIOR APPROACH (Right) Patient reports pain as mild.   Patient seen in rounds with Dr. Lequita Halt.  Patient is well, and has had no acute complaints or problems. Slept well last night, states she feels ready to go home today. Voiding and positive flatus. Denies SOB, chest pain, calf pain. We will continue with therapy today.   Objective: Vital signs in last 24 hours: Temp:  [97.2 F (36.2 C)-98.4 F (36.9 C)] 98 F (36.7 C) (05/23 0550) Pulse Rate:  [61-69] 61 (05/23 0550) Resp:  [10-20] 17 (05/22 2134) BP: (90-125)/(55-90) 109/90 (05/23 0550) SpO2:  [95 %-100 %] 100 % (05/23 0550) Weight:  [68.9 kg (152 lb)] 68.9 kg (152 lb) (05/22 1110)  Intake/Output from previous day:  Intake/Output Summary (Last 24 hours) at 06/27/2017 0731 Last data filed at 06/27/2017 0656 Gross per 24 hour  Intake 6656.56 ml  Output 3385 ml  Net 3271.56 ml    Labs: Recent Labs    06/27/17 0544  HGB 9.8*   Recent Labs    06/27/17 0544  WBC 9.0  RBC 3.64*  HCT 30.8*  PLT 286   Recent Labs    06/27/17 0544  NA 140  K 4.6  CL 108  CO2 24  BUN 8  CREATININE 0.74  GLUCOSE 97  CALCIUM 8.4*   Exam: General - Patient is Alert and Oriented Extremity - Neurologically intact Neurovascular intact Sensation intact distally Intact pulses distally Dorsiflexion/Plantar flexion intact Dressing - dressing C/D/I Motor Function - intact, moving foot and toes well on exam.  Past Medical History:  Diagnosis Date  . Arthritis   . Colitis   . Complication of anesthesia   . Interstitial cystitis   . Kidney stones   . PONV (postoperative nausea and vomiting)   . UTI (lower urinary tract infection)     Assessment/Plan: 1 Day Post-Op Procedure(s) (LRB): RIGHT TOTAL HIP ARTHROPLASTY ANTERIOR APPROACH (Right) Active Problems:   OA (osteoarthritis) of hip  Estimated body mass index is 24.53 kg/m as calculated from  the following:   Height as of this encounter: 5\' 6"  (1.676 m).   Weight as of this encounter: 68.9 kg (152 lb). Advance diet Up with therapy D/C IV fluids  DVT Prophylaxis - Aspirin Weight bearing as tolerated. D/C O2 and pulse ox and try on room air. Hemovac pulled without difficulty, will continue with therapy.  Plan is to go Home with HEP after hospital stay. Plan for discharge later today after two sessions of therapy if meeting goals.  , PA-C Orthopedic Surgery 06/27/2017, 7:31 AM

## 2017-06-27 NOTE — Progress Notes (Signed)
Discharge planning, no HH needs identified. Plan for HEP, has DME. [phone] 

## 2017-06-28 LAB — CBC
HEMATOCRIT: 31.2 % — AB (ref 36.0–46.0)
Hemoglobin: 9.8 g/dL — ABNORMAL LOW (ref 12.0–15.0)
MCH: 26.8 pg (ref 26.0–34.0)
MCHC: 31.4 g/dL (ref 30.0–36.0)
MCV: 85.5 fL (ref 78.0–100.0)
Platelets: 278 10*3/uL (ref 150–400)
RBC: 3.65 MIL/uL — ABNORMAL LOW (ref 3.87–5.11)
RDW: 16.1 % — AB (ref 11.5–15.5)
WBC: 10.9 10*3/uL — ABNORMAL HIGH (ref 4.0–10.5)

## 2017-06-28 LAB — BASIC METABOLIC PANEL
Anion gap: 8 (ref 5–15)
BUN: 12 mg/dL (ref 6–20)
CALCIUM: 8.4 mg/dL — AB (ref 8.9–10.3)
CHLORIDE: 105 mmol/L (ref 101–111)
CO2: 27 mmol/L (ref 22–32)
CREATININE: 0.72 mg/dL (ref 0.44–1.00)
GFR calc Af Amer: >60 mL/min (ref >60)
GFR calc non Af Amer: >60 mL/min (ref >60)
GLUCOSE: 109 mg/dL — AB (ref 65–99)
Potassium: 3.5 mmol/L (ref 3.5–5.1)
Sodium: 140 mmol/L (ref 135–145)

## 2017-06-28 NOTE — Progress Notes (Signed)
Physical Therapy Treatment Patient Details Name: Brandi Hines MRN: [id number] DOB: February 08, 1950 Today's Date: 06/28/2017    History of Present Illness Pt s/p R THR and with hx of L THR 1/19    PT Comments    Pt progressing steadily with mobility.  Son present and reviewed stairs, car transfers and home therex program with written instruction and progression provided.   Follow Up Recommendations  Follow surgeon's recommendation for DC plan and follow-up therapies     Equipment Recommendations  None recommended by PT    Recommendations for Other Services       Precautions / Restrictions Precautions Precautions: Fall Restrictions Weight Bearing Restrictions: No Other Position/Activity Restrictions: WBAT    Mobility  Bed Mobility Overal bed mobility: Needs Assistance Bed Mobility: Supine to Sit;Sit to Supine     Supine to sit: Min guard Sit to supine: Min assist   General bed mobility comments: cues for sequence and use of L LE to self assist  Transfers Overall transfer level: Needs assistance Equipment used: Rolling walker (2 wheeled) Transfers: Sit to/from Stand Sit to Stand: Min guard;Supervision         General transfer comment: cues for LE management and use of UEs to self assist  Ambulation/Gait Ambulation/Gait assistance: Min guard;Supervision Ambulation Distance (Feet): 75 Feet Assistive device: Rolling walker (2 wheeled) Gait Pattern/deviations: Step-to pattern;Decreased step length - right;Decreased step length - left;Shuffle Gait velocity: decr   General Gait Details: cues for sequence, posture and position from RW - distance ltd by pain   Stairs Stairs: Yes Stairs assistance: Min assist Stair Management: No rails;Step to pattern;Backwards;With walker Number of Stairs: 4 General stair comments: single step twice and double step once; cues for sequence and foot/RW placement.  Written instruction provided.  Son present   Wheelchair Mobility     Modified Rankin (Stroke Patients Only)       Balance                                            Cognition Arousal/Alertness: Awake/alert Behavior During Therapy: WFL for tasks assessed/performed Overall Cognitive Status: Within Functional Limits for tasks assessed                                        Exercises Total Joint Exercises Ankle Circles/Pumps: AROM;Both;15 reps;Supine Quad Sets: AROM;Both;10 reps;Supine Heel Slides: AAROM;Right;20 reps;Supine Hip ABduction/ADduction: AAROM;Right;15 reps;Supine Long Arc Quad: AROM;Right;10 reps;Seated    General Comments        Pertinent Vitals/Pain Pain Assessment: 0-10 Pain Score: 7  Pain Location: R hip Pain Descriptors / Indicators: Aching;Sore Pain Intervention(s): Limited activity within patient's tolerance;Monitored during session;Premedicated before session;Ice applied    Home Living                      Prior Function            PT Goals (current goals can now be found in the care plan section) Acute Rehab PT Goals Patient Stated Goal: Regain IND PT Goal Formulation: With patient Time For Goal Achievement: 07/03/17 Potential to Achieve Goals: Good Progress towards PT goals: Progressing toward goals    Frequency    7X/week      PT Plan Current plan remains appropriate    Co-evaluation  AM-PAC PT "6 Clicks" Daily Activity  Outcome Measure  Difficulty turning over in bed (including adjusting bedclothes, sheets and blankets)?: A Lot Difficulty moving from lying on back to sitting on the side of the bed? : A Lot Difficulty sitting down on and standing up from a chair with arms (e.g., wheelchair, bedside commode, etc,.)?: A Lot Help needed moving to and from a bed to chair (including a wheelchair)?: A Little Help needed walking in hospital room?: A Little Help needed climbing 3-5 steps with a railing? : A Little 6 Click Score: 15     End of Session Equipment Utilized During Treatment: Gait belt Activity Tolerance: Patient limited by pain;Patient tolerated treatment well Patient left: in bed;with call bell/phone within reach;with family/visitor present Nurse Communication: Mobility status PT Visit Diagnosis: Difficulty in walking, not elsewhere classified (R26.2)     Time: 5573-2202 PT Time Calculation (min) (ACUTE ONLY): 63 min  Charges:  $Gait Training: 23-37 mins $Therapeutic Exercise: 8-22 mins $Therapeutic Activity: 8-22 mins                    G Codes:       Pg [phone]    Maebel Marasco 06/28/2017, 12:43 PM

## 2017-06-28 NOTE — Progress Notes (Signed)
   Subjective: 2 Days Post-Op Procedure(s) (LRB): RIGHT TOTAL HIP ARTHROPLASTY ANTERIOR APPROACH (Right) Patient reports pain as moderate.   Patient seen in rounds with Dr. Lequita Halt. Patient is well, but has had some minor complaints of pain yesterday which delayed discharge from hospital. States hip is better and well managed this AM and is ready for discharge.  We will resume therapy today.  Plan is to go Home after hospital stay.  Objective: Vital signs in last 24 hours: Temp:  [99.2 F (37.3 C)-100.3 F (37.9 C)] 100.3 F (37.9 C) (05/24 0604) Pulse Rate:  [74-75] 74 (05/24 0604) BP: (127-130)/(73-76) 127/76 (05/24 0604) SpO2:  [94 %-100 %] 94 % (05/24 0604)  Intake/Output from previous day:  Intake/Output Summary (Last 24 hours) at 06/28/2017 0716 Last data filed at 06/28/2017 0612 Gross per 24 hour  Intake 980 ml  Output 1100 ml  Net -120 ml    Labs: Recent Labs    06/27/17 0544 06/28/17 0537  HGB 9.8* 9.8*   Recent Labs    06/27/17 0544 06/28/17 0537  WBC 9.0 10.9*  RBC 3.64* 3.65*  HCT 30.8* 31.2*  PLT 286 278   Recent Labs    06/27/17 0544 06/28/17 0537  NA 140 140  K 4.6 3.5  CL 108 105  CO2 24 27  BUN 8 12  CREATININE 0.74 0.72  GLUCOSE 97 109*  CALCIUM 8.4* 8.4*   EXAM General - Patient is Alert and Oriented Extremity - Neurologically intact Neurovascular intact Sensation intact distally Intact pulses distally Dressing - dressing C/D/I Motor Function - intact, moving foot and toes well on exam.   Past Medical History:  Diagnosis Date  . Arthritis   . Colitis   . Complication of anesthesia   . Interstitial cystitis   . Kidney stones   . PONV (postoperative nausea and vomiting)   . UTI (lower urinary tract infection)     Assessment/Plan: 2 Days Post-Op Procedure(s) (LRB): RIGHT TOTAL HIP ARTHROPLASTY ANTERIOR APPROACH (Right) Active Problems:   OA (osteoarthritis) of hip  Estimated body mass index is 24.53 kg/m as calculated  from the following:   Height as of this encounter: 5\' 6"  (1.676 m).   Weight as of this encounter: 68.9 kg (152 lb). Up with therapy D/C IV fluids  DVT Prophylaxis - Aspirin Weight Bearing As Tolerated  Plan for D/C home today with HEP after therapy session if meeting goals and stable.  , PA-C Orthopaedic Surgery 06/28/2017, 7:16 AM

## 2017-06-28 NOTE — Discharge Summary (Signed)
Physician Discharge Summary   Patient ID: Brandi Hines MRN: [id number] DOB/AGE: June 03, 1950 67 y.o.  Admit date: 06/26/2017 Discharge date: 06/28/2017  Primary Diagnosis: Osteoarthritis right hip  Admission Diagnoses: Osteoarthritis right hip   Past Medical History:  Diagnosis Date  . Arthritis   . Colitis   . Complication of anesthesia   . Interstitial cystitis   . Kidney stones   . PONV (postoperative nausea and vomiting)   . UTI (lower urinary tract infection)    Discharge Diagnoses:   Active Problems:   OA (osteoarthritis) of hip  Estimated body mass index is 24.53 kg/m as calculated from the following:   Height as of this encounter: 5' 6"  (1.676 m).   Weight as of this encounter: 68.9 kg (152 lb).  Procedure(s) (LRB): RIGHT TOTAL HIP ARTHROPLASTY ANTERIOR APPROACH (Right)   Consults: None  HPI: Brandi Hines is a 67 y.o. female who has advanced end-  stage arthritis of their Right  hip with progressively worsening pain and  dysfunction.The patient has failed nonoperative management and presents for  total hip arthroplasty.   Laboratory Data: Admission on 06/26/2017, Discharged on 06/28/2017  Component Date Value Ref Range Status  . aPTT 06/26/2017 27  24 - 36 seconds Final   Performed at [facility], [place] [address] [place], [place] [address]  . WBC 06/27/2017 9.0  4.0 - 10.5 K/uL Final  . RBC 06/27/2017 3.64* 3.87 - 5.11 MIL/uL Final  . Hemoglobin 06/27/2017 9.8* 12.0 - 15.0 g/dL Final  . HCT 06/27/2017 30.8* 36.0 - 46.0 % Final  . MCV 06/27/2017 84.6  78.0 - 100.0 fL Final  . MCH 06/27/2017 26.9  26.0 - 34.0 pg Final  . MCHC 06/27/2017 31.8  30.0 - 36.0 g/dL Final  . RDW 06/27/2017 15.6* 11.5 - 15.5 % Final  . Platelets 06/27/2017 286  150 - 400 K/uL Final   Performed at [facility], [place] [address], [place], [place] [address]  . Sodium 06/27/2017 140  135 - 145 mmol/L Final  . Potassium 06/27/2017 4.6  3.5 - 5.1  mmol/L Final  . Chloride 06/27/2017 108  101 - 111 mmol/L Final  . CO2 06/27/2017 24  22 - 32 mmol/L Final  . Glucose, Bld 06/27/2017 97  65 - 99 mg/dL Final  . BUN 06/27/2017 8  6 - 20 mg/dL Final  . Creatinine, Ser 06/27/2017 0.74  0.44 - 1.00 mg/dL Final  . Calcium 06/27/2017 8.4* 8.9 - 10.3 mg/dL Final  . GFR calc non Af Amer 06/27/2017 >60  >60 mL/min Final  . GFR calc Af Amer 06/27/2017 >60  >60 mL/min Final   Comment: (NOTE) The eGFR has been calculated using the CKD EPI equation. This calculation has not been validated in all clinical situations. eGFR's persistently <60 mL/min signify possible Chronic Kidney Disease.   Georgiann Hahn gap 06/27/2017 8  5 - 15 Final   Performed at [facility], [place] [address], [place], [place] [address]  . WBC 06/28/2017 10.9* 4.0 - 10.5 K/uL Final  . RBC 06/28/2017 3.65* 3.87 - 5.11 MIL/uL Final  . Hemoglobin 06/28/2017 9.8* 12.0 - 15.0 g/dL Final  . HCT 06/28/2017 31.2* 36.0 - 46.0 % Final  . MCV 06/28/2017 85.5  78.0 - 100.0 fL Final  . MCH 06/28/2017 26.8  26.0 - 34.0 pg Final  . MCHC 06/28/2017 31.4  30.0 - 36.0 g/dL Final  . RDW 06/28/2017 16.1* 11.5 - 15.5 % Final  .  Platelets 06/28/2017 278  150 - 400 K/uL Final   Performed at [facility], [place] [address], [place], [place] [address]  . Sodium 06/28/2017 140  135 - 145 mmol/L Final  . Potassium 06/28/2017 3.5  3.5 - 5.1 mmol/L Final   Comment: DELTA CHECK NOTED REPEATED TO VERIFY   . Chloride 06/28/2017 105  101 - 111 mmol/L Final  . CO2 06/28/2017 27  22 - 32 mmol/L Final  . Glucose, Bld 06/28/2017 109* 65 - 99 mg/dL Final  . BUN 06/28/2017 12  6 - 20 mg/dL Final  . Creatinine, Ser 06/28/2017 0.72  0.44 - 1.00 mg/dL Final  . Calcium 06/28/2017 8.4* 8.9 - 10.3 mg/dL Final  . GFR calc non Af Amer 06/28/2017 >60  >60 mL/min Final  . GFR calc Af Amer 06/28/2017 >60  >60 mL/min Final   Comment: (NOTE) The eGFR has been calculated using the CKD EPI  equation. This calculation has not been validated in all clinical situations. eGFR's persistently <60 mL/min signify possible Chronic Kidney Disease.   Georgiann Hahn gap 06/28/2017 8  5 - 15 Final   Performed at [facility], [place] [address] [place], [place] [address]  Hospital Outpatient Visit on 06/18/2017  Component Date Value Ref Range Status  . MRSA, PCR 06/18/2017 NEGATIVE  NEGATIVE Final  . Staphylococcus aureus 06/18/2017 NEGATIVE  NEGATIVE Final   Comment: (NOTE) The Xpert SA Assay (FDA approved for NASAL specimens in patients 33 years of age and older), is one component of a comprehensive surveillance program. It is not intended to diagnose infection nor to guide or monitor treatment. Performed at [facility], [place] [address] [place], [place] [address]   . WBC 06/18/2017 5.1  4.0 - 10.5 K/uL Final  . RBC 06/18/2017 4.62  3.87 - 5.11 MIL/uL Final  . Hemoglobin 06/18/2017 12.5  12.0 - 15.0 g/dL Final  . HCT 06/18/2017 39.7  36.0 - 46.0 % Final  . MCV 06/18/2017 85.9  78.0 - 100.0 fL Final  . MCH 06/18/2017 27.1  26.0 - 34.0 pg Final  . MCHC 06/18/2017 31.5  30.0 - 36.0 g/dL Final  . RDW 06/18/2017 15.4  11.5 - 15.5 % Final  . Platelets 06/18/2017 346  150 - 400 K/uL Final   Performed at [facility], [place] [address], [place],  [address]  . Sodium 06/18/2017 138  135 - 145 mmol/L Final  . Potassium 06/18/2017 3.8  3.5 - 5.1 mmol/L Final  . Chloride 06/18/2017 105  101 - 111 mmol/L Final  . CO2 06/18/2017 24  22 - 32 mmol/L Final  . Glucose, Bld 06/18/2017 70  65 - 99 mg/dL Final  . BUN 06/18/2017 14  6 - 20 mg/dL Final  . Creatinine, Ser 06/18/2017 0.78  0.44 - 1.00 mg/dL Final  . Calcium 06/18/2017 8.6* 8.9 - 10.3 mg/dL Final  . Total Protein 06/18/2017 6.8  6.5 - 8.1 g/dL Final  . Albumin 06/18/2017 3.5  3.5 - 5.0 g/dL Final  . AST 06/18/2017 24  15 - 41 U/L Final  . ALT 06/18/2017 15  14 - 54 U/L Final  .  Alkaline Phosphatase 06/18/2017 82  38 - 126 U/L Final  . Total Bilirubin 06/18/2017 0.1* 0.3 - 1.2 mg/dL Final  . GFR calc non Af Amer 06/18/2017 >60  >60 mL/min Final  . GFR calc Af Amer 06/18/2017 >60  >60 mL/min Final   Comment: (NOTE) The eGFR has been  calculated using the CKD EPI equation. This calculation has not been validated in all clinical situations. eGFR's persistently <60 mL/min signify possible Chronic Kidney Disease.   Georgiann Hahn gap 06/18/2017 9  5 - 15 Final   Performed at [facility], [place] [address], [place], [place] [address]  . Prothrombin Time 06/18/2017 13.0  11.4 - 15.2 seconds Final  . INR 06/18/2017 0.99   Final   Performed at [place] [address] [place], [place] [address]  . ABO/RH(D) 06/18/2017 A POS   Final  . Antibody Screen 06/18/2017 NEG   Final  . Sample Expiration 06/18/2017 06/29/2017   Final  . Extend sample reason 06/18/2017    Final                   Value:NO TRANSFUSIONS OR PREGNANCY IN THE PAST 3 MONTHS Performed at [facility] [place] [address] [place], [place] [address]   . Color, Urine 06/18/2017 YELLOW  YELLOW Final  . APPearance 06/18/2017 CLEAR  CLEAR Final  . Specific Gravity, Urine 06/18/2017 1.015  1.005 - 1.030 Final  . pH 06/18/2017 6.5  5.0 - 8.0 Final  . Glucose, UA 06/18/2017 NEGATIVE  NEGATIVE mg/dL Final  . Hgb urine dipstick 06/18/2017 SMALL* NEGATIVE Final  . Bilirubin Urine 06/18/2017 NEGATIVE  NEGATIVE Final  . Ketones, ur 06/18/2017 NEGATIVE  NEGATIVE mg/dL Final  . Protein, ur 06/18/2017 NEGATIVE  NEGATIVE mg/dL Final  . Nitrite 06/18/2017 NEGATIVE  NEGATIVE Final  . Leukocytes, UA 06/18/2017 NEGATIVE  NEGATIVE Final   Performed at [facility], [place] [address], [place], [place] [address]  . RBC / HPF 06/18/2017 0-5  0 - 5 RBC/hpf Final  . WBC, UA 06/18/2017 0-5  0 - 5 WBC/hpf Final  . Bacteria, UA 06/18/2017 RARE* NONE SEEN Final  .  Squamous Epithelial / LPF 06/18/2017 0-5  0 - 5 Final  . Mucus 06/18/2017 PRESENT   Final  . Hyaline Casts, UA 06/18/2017 PRESENT   Final   Performed at [facility], [place] [address] [place], [place] [address]     X-Rays:Dg Pelvis Portable  Result Date: 06/26/2017 CLINICAL DATA:  Right hip arthroplasty EXAM: PORTABLE PELVIS 1-2 VIEWS COMPARISON:  12/12/2016 pelvic radiograph FINDINGS: Interval right total hip arthroplasty. Previous left total hip arthroplasty. No evidence of hardware fracture or loosening. No evidence of hip dislocation. Surgical drain terminates over the right hip joint. No acute osseous fracture. Expected soft tissue gas surrounding the right hip joint. No suspicious focal osseous lesions. IMPRESSION: Satisfactory immediate postoperative appearance status post interval right total hip arthroplasty. Electronically Signed   By: Ilona Sorrel M.D.   On: 06/26/2017 09:29   Dg C-arm 1-60 Min-no Report  Result Date: 06/26/2017 Fluoroscopy was utilized by the requesting physician.  No radiographic interpretation.    EKG: Orders placed or performed during the hospital encounter of 12/12/16  . EKG 12-Lead  . EKG 12-Lead  . EKG     Hospital Course: Patient was admitted to [facility] and taken to the OR and underwent the above state procedure without complications.  Patient tolerated the procedure well and was later transferred to the recovery room and then to the orthopaedic floor for postoperative care.  They were given PO and IV analgesics for pain control following their surgery.  They were given 24 hours of postoperative antibiotics of  Anti-infectives (From admission, onward)   Start     Dose/Rate  Route Frequency Ordered Stop   06/26/17 1300  ceFAZolin (ANCEF) IVPB 1 g/50 mL premix     1 g 100 mL/hr over 30 Minutes Intravenous Every 6 hours 06/26/17 1106 06/26/17 2017   06/26/17 0600  ceFAZolin (ANCEF) IVPB 2g/100 mL premix     2 g 200 mL/hr  over 30 Minutes Intravenous On call to O.R. 06/26/17 6160 06/26/17 0732     and started on DVT prophylaxis in the form of Aspirin.   PT and OT were ordered for total hip protocol.  The patient was allowed to be WBAT with therapy. Discharge planning was consulted to help with postop disposition and equipment needs.  Patient had a good night on the evening of surgery.  They started to get up OOB with therapy the afternoon following surgery.  Hemovac drain was pulled without difficulty on POD #1 and therapy was continued.  Patient was prepared to go home on POD #1, however she experienced a greater amount of pain in the right hip that day and it was decided to keep patient one more night. She continued to work with therapy into day two.  Dressing was changed on day two and the incision was clean, dry, and intact.  On POD #2, patient had progressed with therapy and meeting their goals.  Incision was healing well.  Patient was seen in rounds and was ready to go home.   Diet: Regular diet Activity:WBAT Follow-up: in 2 weeks Disposition - Home with HEP  Discharged Condition: stable   Discharge Instructions    Call MD / Call 911   Complete by:  As directed    If you experience chest pain or shortness of breath, CALL 911 and be transported to the hospital emergency room.  If you develope a fever above 101 F, pus (white drainage) or increased drainage or redness at the wound, or calf pain, call your surgeon's office.   Change dressing   Complete by:  As directed    You may change your dressing on Friday (05/23), then change the dressing daily with sterile 4 x 4 inch gauze dressing and paper tape.  You may clean the incision with alcohol prior to redressing   Constipation Prevention   Complete by:  As directed    Drink plenty of fluids.  Prune juice may be helpful.  You may use a stool softener, such as Colace (over the counter) 100 mg twice a day.  Use MiraLax (over the counter) for constipation as needed.    Diet general   Complete by:  As directed    Discharge instructions   Complete by:  As directed    Dr. Gaynelle Arabian Total Joint Specialist [facility] [address], [place], [place] [address] [phone]  ANTERIOR APPROACH TOTAL HIP REPLACEMENT POSTOPERATIVE DIRECTIONS   Hip Rehabilitation, Guidelines Following Surgery  The results of a hip operation are greatly improved after range of motion and muscle strengthening exercises. Follow all safety measures which are given to protect your hip. If any of these exercises cause increased pain or swelling in your joint, decrease the amount until you are comfortable again. Then slowly increase the exercises. Call your caregiver if you have problems or questions.   BLOOD CLOT PREVENTION:  Take one tablet (325 mg) Aspirin two times a day for three weeks following surgery. Then take one baby Aspirin (81 mg) once a day for three weeks. Then discontinue aspirin.   HOME CARE INSTRUCTIONS:  Remove items at home which  could result in a fall. This includes throw rugs or furniture in walking pathways.  ICE to the affected hip every three hours for 30 minutes at a time and then as needed for pain and swelling.  Continue to use ice on the hip for pain and swelling from surgery. You may notice swelling that will progress down to the foot and ankle.  This is normal after surgery.  Elevate the leg when you are not up walking on it.   Continue to use the breathing machine which will help keep your temperature down.  It is common for your temperature to cycle up and down following surgery, especially at night when you are not up moving around and exerting yourself.  The breathing machine keeps your lungs expanded and your temperature down.   DIET You may resume your previous home diet once your are discharged from the hospital.  DRESSING / WOUND CARE / SHOWERING You may shower 3 days after surgery, but keep the wounds dry during showering.   You may use an occlusive plastic wrap (Press'n Seal for example), NO SOAKING/SUBMERGING IN THE BATHTUB.  If the bandage gets wet, change with a clean dry gauze.  If the incision gets wet, pat the wound dry with a clean towel. You may start showering once you are discharged home but do not submerge the incision under water. Just pat the incision dry and apply a dry gauze dressing on daily. Change the surgical dressing daily and reapply a dry dressing each time.  ACTIVITY Walk with your walker as instructed. Use walker as long as suggested by your caregivers. Avoid periods of inactivity such as sitting longer than an hour when not asleep. This helps prevent blood clots.  You may resume a sexual relationship in one month or when given the OK by your doctor.  You may return to work once you are cleared by your doctor.  Do not drive a car for 6 weeks or until released by you surgeon.  Do not drive while taking narcotics.  WEIGHT BEARING Weight bearing as tolerated with assist device (walker, cane, etc) as directed, use it as long as suggested by your surgeon or therapist, typically at least 4-6 weeks.  POSTOPERATIVE CONSTIPATION PROTOCOL Constipation - defined medically as fewer than three stools per week and severe constipation as less than one stool per week.  One of the most common issues patients have following surgery is constipation.  Even if you have a regular bowel pattern at home, your normal regimen is likely to be disrupted due to multiple reasons following surgery.  Combination of anesthesia, postoperative narcotics, change in appetite and fluid intake all can affect your bowels.  In order to avoid complications following surgery, here are some recommendations in order to help you during your recovery period.  Colace (docusate) - Pick up an over-the-counter form of Colace or another stool softener and take twice a day as long as you are requiring postoperative pain medications.  Take with  a full glass of water daily.  If you experience loose stools or diarrhea, hold the colace until you stool forms back up.  If your symptoms do not get better within 1 week or if they get worse, check with your doctor.  Dulcolax (bisacodyl) - Pick up over-the-counter and take as directed by the product packaging as needed to assist with the movement of your bowels.  Take with a full glass of water.  Use this product as needed if not relieved by  Colace only.   MiraLax (polyethylene glycol) - Pick up over-the-counter to have on hand.  MiraLax is a solution that will increase the amount of water in your bowels to assist with bowel movements.  Take as directed and can mix with a glass of water, juice, soda, coffee, or tea.  Take if you go more than two days without a movement. Do not use MiraLax more than once per day. Call your doctor if you are still constipated or irregular after using this medication for 7 days in a row.  If you continue to have problems with postoperative constipation, please contact the office for further assistance and recommendations.  If you experience "the worst abdominal pain ever" or develop nausea or vomiting, please contact the office immediatly for further recommendations for treatment.  ITCHING  If you experience itching with your medications, try taking only a single pain pill, or even half a pain pill at a time.  You can also use Benadryl over the counter for itching or also to help with sleep.   TED HOSE STOCKINGS Wear the elastic stockings on both legs for three weeks following surgery during the day but you may remove then at night for sleeping.  MEDICATIONS See your medication summary on the "After Visit Summary" that the nursing staff will review with you prior to discharge.  You may have some home medications which will be placed on hold until you complete the course of blood thinner medication.  It is important for you to complete the blood thinner medication as  prescribed by your surgeon.  Continue your approved medications as instructed at time of discharge.  PRECAUTIONS If you experience chest pain or shortness of breath - call 911 immediately for transfer to the hospital emergency department.  If you develop a fever greater that 101 F, purulent drainage from wound, increased redness or drainage from wound, foul odor from the wound/dressing, or calf pain - CONTACT YOUR SURGEON.                                                   FOLLOW-UP APPOINTMENTS Make sure you keep all of your appointments after your operation with your surgeon and caregivers. You should call the office at the above phone number and make an appointment for approximately two weeks after the date of your surgery or on the date instructed by your surgeon outlined in the "After Visit Summary".  RANGE OF MOTION AND STRENGTHENING EXERCISES  These exercises are designed to help you keep full movement of your hip joint. Follow your caregiver's or physical therapist's instructions. Perform all exercises about fifteen times, three times per day or as directed. Exercise both hips, even if you have had only one joint replacement. These exercises can be done on a training (exercise) mat, on the floor, on a table or on a bed. Use whatever works the best and is most comfortable for you. Use music or television while you are exercising so that the exercises are a pleasant break in your day. This will make your life better with the exercises acting as a break in routine you can look forward to.  Lying on your back, slowly slide your foot toward your buttocks, raising your knee up off the floor. Then slowly slide your foot back down until your leg is straight again.  Lying on  your back spread your legs as far apart as you can without causing discomfort.  Lying on your side, raise your upper leg and foot straight up from the floor as far as is comfortable. Slowly lower the leg and repeat.  Lying on your  back, tighten up the muscle in the front of your thigh (quadriceps muscles). You can do this by keeping your leg straight and trying to raise your heel off the floor. This helps strengthen the largest muscle supporting your knee.  Lying on your back, tighten up the muscles of your buttocks both with the legs straight and with the knee bent at a comfortable angle while keeping your heel on the floor.   IF YOU ARE TRANSFERRED TO A SKILLED REHAB FACILITY If the patient is transferred to a skilled rehab facility following release from the hospital, a list of the current medications will be sent to the facility for the patient to continue.  When discharged from the skilled rehab facility, please have the facility set up the patient's [place] prior to being released. Also, the skilled facility will be responsible for providing the patient with their medications at time of release from the facility to include their pain medication, the muscle relaxants, and their blood thinner medication. If the patient is still at the rehab facility at time of the two week follow up appointment, the skilled rehab facility will also need to assist the patient in arranging follow up appointment in our office and any transportation needs.  MAKE SURE YOU:  Understand these instructions.  Get help right away if you are not doing well or get worse.    Pick up stool softner and laxative for home use following surgery while on pain medications. Do not submerge incision under water. Please use good hand washing techniques while changing dressing each day. May shower starting three days after surgery. Please use a clean towel to pat the incision dry following showers. Continue to use ice for pain and swelling after surgery. Do not use any lotions or creams on the incision until instructed by your surgeon.   Driving restrictions   Complete by:  As directed    No driving for 2 weeks   Increase activity slowly  as tolerated   Complete by:  As directed    TED hose   Complete by:  As directed    Use stockings (TED hose) for 3 weeks on both leg(s).  You may remove them at night for sleeping.   Weight bearing as tolerated   Complete by:  As directed      Allergies as of 06/28/2017   No Known Allergies     Medication List    TAKE these medications   amitriptyline 50 MG tablet Commonly known as:  ELAVIL Take 1 tablet (50 mg total) by mouth at bedtime. What changed:  additional instructions   aspirin 325 MG EC tablet Take 1 tablet (325 mg total) by mouth 2 (two) times daily. Take one 325 mg tablet two times a day for three weeks to prevent blood clots.   butalbital-acetaminophen-caffeine 50-325-40 MG tablet Commonly known as:  FIORICET, ESGIC Take 1-2 tablets by mouth every 6 (six) hours as needed for migraine.   estradiol 0.5 MG tablet Commonly known as:  ESTRACE Take 0.5 mg by mouth daily.   FLUoxetine 40 MG capsule Commonly known as:  PROZAC Take 40 mg by mouth daily.   KLS ALLER-TEC PO Take 10 mg by mouth daily.  LORazepam 0.5 MG tablet Commonly known as:  ATIVAN Take 0.5 mg by mouth 2 (two) times daily as needed for anxiety.   methocarbamol 500 MG tablet Commonly known as:  ROBAXIN Take 1 tablet (500 mg total) by mouth every 6 (six) hours as needed for muscle spasms.   NASACORT AQ NA Place 1 spray into the nose 2 (two) times daily as needed (for nasal congestion.).   oxyCODONE 5 MG immediate release tablet Commonly known as:  Oxy IR/ROXICODONE Take 1-2 tablets (5-10 mg total) by mouth every 6 (six) hours as needed for moderate pain or severe pain.   PROBIOTIC PO Take 1 capsule by mouth daily.   traMADol 50 MG tablet Commonly known as:  ULTRAM Take 1-2 tablets (50-100 mg total) by mouth every 6 (six) hours as needed for moderate pain (use oxycodone first for moderate pain).   triamcinolone cream 0.1 % Commonly known as:  KENALOG Apply 1 application topically 2  (two) times daily as needed. For skin irritation.   zolpidem 10 MG tablet Commonly known as:  AMBIEN Take 5-10 mg by mouth at bedtime as needed for sleep.            Discharge Care Instructions  (From admission, onward)        Start     Ordered   06/27/17 0000  Weight bearing as tolerated     06/27/17 0740   06/27/17 0000  Change dressing    Comments:  You may change your dressing on Friday (05/23), then change the dressing daily with sterile 4 x 4 inch gauze dressing and paper tape.  You may clean the incision with alcohol prior to redressing   06/27/17 0740     Follow-up Information    Gaynelle Arabian, MD Follow up in 2 week(s).   Specialty:  Orthopedic Surgery Contact information: [address] [place] [address] [phone]           Signed: Theresa Duty, PA-C Orthopaedic Surgery 06/28/2017, 2:46 PM

## 2017-12-20 ENCOUNTER — Ambulatory Visit (INDEPENDENT_AMBULATORY_CARE_PROVIDER_SITE_OTHER): Payer: Self-pay | Admitting: Plastic Surgery

## 2017-12-20 ENCOUNTER — Encounter: Payer: Self-pay | Admitting: Plastic Surgery

## 2017-12-20 DIAGNOSIS — Z719 Counseling, unspecified: Secondary | ICD-10-CM | POA: Insufficient documentation

## 2017-12-20 NOTE — Progress Notes (Signed)
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 I discussed with the patient this proposed procedure of botulinum toxin injections, which is customized depending on the particular needs of the patient. It is performed on facial rhytids as a temporary correction. The alternatives were discussed with the patient. The risks were addressed including bleeding, scarring, infection, damage to deeper structures, asymmetry, and chronic pain, which may occur infrequently after a procedure. The individual's choice to undergo a surgical procedure is based on the comparison of risks to potential benefits. Other risks include unsatisfactory results, brow ptosis, eyelid ptosis, allergic reaction, temporary paralysis, which should go away with time, bruising, blurring disturbances and delayed healing. Botulinum toxin injections do not arrest the aging process or produce permanent tightening of the eyelid.  Operative intervention maybe necessary to maintain the results of a blepharoplasty or botulinum toxin. The patient understands and wishes to proceed. An informed consent was signed and informational brochures given to her prior to the procedure.  Procedure: The area was prepped with alcohol and dried with a clean gauze. Using a clean technique, the botulinum toxin was diluted with 1.25 cc of preservative-free normal saline which was slowly injected with an 18 gauge needle in a tuberculin syringes.  A 32 gauge needles were then used to inject the botulinum toxin. This mixture allow for an aliquot of 5 units per 0.1 cc in each injection site.    Subsequently the mixture was injected in the glabellar and forehead area with preservation of the temporal  branch to the lateral eyebrow as well as into each lateral canthal area beginning from the lateral orbital rim medial to the zygomaticus major in 3 separate areas. A total of 20 Units of botulinum toxin was used. The forehead and glabellar area was injected with care to inject intramuscular only while holding pressure on the supratrochlear vessels in each area during each injection on either side of the medial corrugators. The injection proceeded vertically superiorly to the medial 2/3 of the frontalis muscle and superior 2/3 of the lateral frontalis, again with preservation of the frontal branch.  The midface area was injected at the 3 sub-regions of the mid-face: zygomaticomalar region, anteromedial cheek region, and submalar region for a total of one syringe on each side of the face. The technique used was serial puncture with equal injections in the 3 sub-regions: the zygomaticomalar region, the anteromedial cheek, and the submalar region.  No complications were noted. Light pressure was held for 5 minutes. She was instructed explicitly in post-operative care.  Botox LOT:  [id number] C2 EXP:  3/22  J. Neomia Dear LOT: [id number] EXP: 2019-02-24

## 2018-03-18 ENCOUNTER — Encounter: Payer: Self-pay | Admitting: Plastic Surgery

## 2018-03-18 ENCOUNTER — Ambulatory Visit (INDEPENDENT_AMBULATORY_CARE_PROVIDER_SITE_OTHER): Payer: Self-pay | Admitting: Plastic Surgery

## 2018-03-18 ENCOUNTER — Other Ambulatory Visit: Payer: Self-pay | Admitting: Obstetrics

## 2018-03-18 VITALS — BP 134/91 | HR 68 | Temp 98.4°F | Ht 66.0 in | Wt 148.0 lb

## 2018-03-18 DIAGNOSIS — Z1231 Encounter for screening mammogram for malignant neoplasm of breast: Secondary | ICD-10-CM

## 2018-03-18 DIAGNOSIS — Z719 Counseling, unspecified: Secondary | ICD-10-CM

## 2018-03-18 NOTE — Progress Notes (Signed)
Preoperative Dx: hyperpigmentation  Postoperative Dx:  same  Procedure: laser to face   Anesthesia: none  Description of Procedure:  Risks and complications were explained to the patient. Consent was confirmed and signed. Time out was called and all information was confirmed to be correct. The area  area was prepped with alcohol and wiped dry. The [id number] laser was set at 9.8 Joules. The face was lasered. The patient tolerated the procedure well and there were no complications. The patient is to follow up in 4 weeks.

## 2018-04-18 ENCOUNTER — Other Ambulatory Visit: Payer: Self-pay

## 2018-04-18 ENCOUNTER — Encounter: Payer: Self-pay | Admitting: Plastic Surgery

## 2018-04-18 ENCOUNTER — Ambulatory Visit (INDEPENDENT_AMBULATORY_CARE_PROVIDER_SITE_OTHER): Payer: Self-pay | Admitting: Plastic Surgery

## 2018-04-18 VITALS — BP 122/77 | HR 73 | Temp 98.6°F | Wt 148.0 lb

## 2018-04-18 DIAGNOSIS — Z719 Counseling, unspecified: Secondary | ICD-10-CM

## 2018-04-18 NOTE — Progress Notes (Signed)
Botulinum Toxin Procedure Note  Procedure: Cosmetic botulinum toxin   Pre-operative Diagnosis: Dynamic rhytides   Post-operative Diagnosis: Same  Complications:  None  Brief history: The patient desires botulinum toxin injection of her forehead. I discussed with the patient this proposed procedure of botulinum toxin injections, which is customized depending on the particular needs of the patient. It is performed on facial rhytids as a temporary correction. The alternatives were discussed with the patient. The risks were addressed including bleeding, scarring, infection, damage to deeper structures, asymmetry, and chronic pain, which may occur infrequently after a procedure. The individual's choice to undergo a surgical procedure is based on the comparison of risks to potential benefits. Other risks include unsatisfactory results, brow ptosis, eyelid ptosis, allergic reaction, temporary paralysis, which should go away with time, bruising, blurring disturbances and delayed healing. Botulinum toxin injections do not arrest the aging process or produce permanent tightening of the eyelid.  Operative intervention maybe necessary to maintain the results of a blepharoplasty or botulinum toxin. The patient understands and wishes to proceed. An informed consent was signed and informational brochures given to her prior to the procedure.  Procedure: The area was prepped with alcohol and dried with a clean gauze. Using a clean technique, the botulinum toxin was diluted with 1.25 cc of preservative-free normal saline which was slowly injected with an 18 gauge needle in a tuberculin syringes.  A 32 gauge needles were then used to inject the botulinum toxin. This mixture allow for an aliquot of 5 units per 0.1 cc in each injection site.    Subsequently the mixture was injected in the glabellar and forehead area with preservation of the temporal branch to the lateral eyebrow as well as into each lateral canthal area  beginning from the lateral orbital rim medial to the zygomaticus major in 3 separate areas. A total of 26 Units of botulinum toxin was used. The forehead and glabellar area was injected with care to inject intramuscular only while holding pressure on the supratrochlear vessels in each area during each injection on either side of the medial corrugators. The injection proceeded vertically superiorly to the medial 2/3 of the frontalis muscle and superior 2/3 of the lateral frontalis, again with preservation of the frontal branch.  No complications were noted. Light pressure was held for 5 minutes. She was instructed explicitly in post-operative care.  Botox LOT:  [id number] C2 EXP:  8/22

## 2018-04-28 ENCOUNTER — Ambulatory Visit: Payer: Medicare Other

## 2018-05-02 ENCOUNTER — Ambulatory Visit: Payer: Medicare Other | Admitting: Plastic Surgery

## 2018-05-26 ENCOUNTER — Ambulatory Visit: Payer: Medicare Other

## 2018-05-27 ENCOUNTER — Ambulatory Visit (INDEPENDENT_AMBULATORY_CARE_PROVIDER_SITE_OTHER): Payer: Self-pay | Admitting: Plastic Surgery

## 2018-05-27 ENCOUNTER — Other Ambulatory Visit: Payer: Self-pay

## 2018-05-27 ENCOUNTER — Encounter: Payer: Self-pay | Admitting: Plastic Surgery

## 2018-05-27 VITALS — BP 119/73 | HR 80 | Temp 98.7°F | Ht 66.0 in | Wt 148.6 lb

## 2018-05-27 DIAGNOSIS — Z719 Counseling, unspecified: Secondary | ICD-10-CM

## 2018-05-27 MED ORDER — LIDOCAINE-PRILOCAINE 2.5-2.5 % EX CREA: 1.0000 "application " | TOPICAL_CREAM | CUTANEOUS | 0 refills | Status: DC | PRN

## 2018-05-27 NOTE — Progress Notes (Signed)
Preoperative Dx: hyperpigmentation  Postoperative Dx:  same  Procedure: laser to face   Anesthesia: none  Description of Procedure:  Risks and complications were explained to the patient. Consent was confirmed and signed. Time out was called and all information was confirmed to be correct. The area  area was prepped with alcohol and wiped dry. The IPL laser was set at 7.2 J/cm2. The face was lasered. The patient tolerated the procedure well and there were no complications. The patient is to follow up in 4 weeks.    

## 2018-06-24 ENCOUNTER — Ambulatory Visit (INDEPENDENT_AMBULATORY_CARE_PROVIDER_SITE_OTHER): Payer: Self-pay | Admitting: Plastic Surgery

## 2018-06-24 ENCOUNTER — Other Ambulatory Visit: Payer: Self-pay

## 2018-06-24 ENCOUNTER — Encounter: Payer: Self-pay | Admitting: Plastic Surgery

## 2018-06-24 VITALS — BP 102/75 | HR 70 | Temp 97.9°F

## 2018-06-24 DIAGNOSIS — Z719 Counseling, unspecified: Secondary | ICD-10-CM

## 2018-06-24 NOTE — Progress Notes (Signed)
Preoperative Dx: hyperpigmentation  Postoperative Dx:  same  Procedure: laser to face   Anesthesia: none  Description of Procedure:  Risks and complications were explained to the patient. Consent was confirmed and signed. Time out was called and all information was confirmed to be correct. The area  area was prepped with alcohol and wiped dry. The IPL laser was set at 7.4 J/cm2. The face was lasered. The patient tolerated the procedure well and there were no complications. The patient is to follow up in 4 weeks.

## 2018-07-08 ENCOUNTER — Other Ambulatory Visit: Payer: Self-pay

## 2018-07-08 ENCOUNTER — Ambulatory Visit
Admission: RE | Admit: 2018-07-08 | Discharge: 2018-07-08 | Disposition: A | Discharge status: Home / Self Care | Payer: Medicare Other | Source: Ambulatory Visit | Attending: Obstetrics | Admitting: Obstetrics

## 2018-07-08 DIAGNOSIS — Z1231 Encounter for screening mammogram for malignant neoplasm of breast: Secondary | ICD-10-CM

## 2018-08-15 ENCOUNTER — Ambulatory Visit (INDEPENDENT_AMBULATORY_CARE_PROVIDER_SITE_OTHER): Payer: Self-pay | Admitting: Plastic Surgery

## 2018-08-15 ENCOUNTER — Encounter: Payer: Self-pay | Admitting: Plastic Surgery

## 2018-08-15 ENCOUNTER — Other Ambulatory Visit: Payer: Self-pay

## 2018-08-15 VITALS — BP 124/80 | HR 74 | Temp 98.2°F | Ht 66.0 in | Wt 150.0 lb

## 2018-08-15 DIAGNOSIS — Z719 Counseling, unspecified: Secondary | ICD-10-CM

## 2018-08-15 NOTE — Progress Notes (Signed)
Botulinum Toxin Procedure Note  Procedure: Cosmetic botulinum toxin  Pre-operative Diagnosis: Dynamic rhytides   Post-operative Diagnosis: Same  Complications:  None  Brief history: The patient desires botulinum toxin injection of her forehead. I discussed with the patient this proposed procedure of botulinum toxin injections, which is customized depending on the particular needs of the patient. It is performed on facial rhytids as a temporary correction. The alternatives were discussed with the patient. The risks were addressed including bleeding, scarring, infection, damage to deeper structures, asymmetry, and chronic pain, which may occur infrequently after a procedure. The individual's choice to undergo a surgical procedure is based on the comparison of risks to potential benefits. Other risks include unsatisfactory results, brow ptosis, eyelid ptosis, allergic reaction, temporary paralysis, which should go away with time, bruising, blurring disturbances and delayed healing. Botulinum toxin injections do not arrest the aging process or produce permanent tightening of the eyelid.  Operative intervention maybe necessary to maintain the results of a blepharoplasty or botulinum toxin. The patient understands and wishes to proceed. An informed consent was signed and informational brochures given to her prior to the procedure.  Procedure: The area was prepped with alcohol and dried with a clean gauze. Using a clean technique, the botulinum toxin was diluted with 1.25 cc of preservative-free normal saline which was slowly injected with an 18 gauge needle in a tuberculin syringes.  A 32 gauge needles were then used to inject the botulinum toxin. This mixture allow for an aliquot of 5 units per 0.1 cc in each injection site.    Subsequently the mixture was injected in the glabellar and forehead area with preservation of the temporal branch to the lateral eyebrow as well as into each lateral canthal area  beginning from the lateral orbital rim medial to the zygomaticus major in 3 separate areas. A total of 30 Units of botulinum toxin was used. The forehead and glabellar area was injected with care to inject intramuscular only while holding pressure on the supratrochlear vessels in each area during each injection on either side of the medial corrugators. The injection proceeded vertically superiorly to the medial 2/3 of the frontalis muscle and superior 2/3 of the lateral frontalis, again with preservation of the frontal branch.  No complications were noted. Light pressure was held for 5 minutes. She was instructed explicitly in post-operative care.  Botox LOT:  [id number] [id number] EXP:  2/22

## 2018-12-12 ENCOUNTER — Encounter: Payer: Self-pay | Admitting: Plastic Surgery

## 2018-12-12 ENCOUNTER — Other Ambulatory Visit: Payer: Self-pay

## 2018-12-12 ENCOUNTER — Ambulatory Visit (INDEPENDENT_AMBULATORY_CARE_PROVIDER_SITE_OTHER): Payer: Self-pay | Admitting: Plastic Surgery

## 2018-12-12 VITALS — BP 126/86 | HR 71 | Temp 98.3°F

## 2018-12-12 DIAGNOSIS — Z719 Counseling, unspecified: Secondary | ICD-10-CM

## 2018-12-12 NOTE — Progress Notes (Signed)
Botulinum Toxin Procedure Note  Procedure: Cosmetic botulinum toxin   Pre-operative Diagnosis: Dynamic rhytides   Post-operative Diagnosis: Same  Complications:  None  Brief history: The patient desires botulinum toxin injection of her forehead. I discussed with the patient this proposed procedure of botulinum toxin injections, which is customized depending on the particular needs of the patient. It is performed on facial rhytids as a temporary correction. The alternatives were discussed with the patient. The risks were addressed including bleeding, scarring, infection, damage to deeper structures, asymmetry, and chronic pain, which may occur infrequently after a procedure. The individual's choice to undergo a surgical procedure is based on the comparison of risks to potential benefits. Other risks include unsatisfactory results, brow ptosis, eyelid ptosis, allergic reaction, temporary paralysis, which should go away with time, bruising, blurring disturbances and delayed healing. Botulinum toxin injections do not arrest the aging process or produce permanent tightening of the eyelid.  Operative intervention maybe necessary to maintain the results of a blepharoplasty or botulinum toxin. The patient understands and wishes to proceed. An informed consent was signed and informational brochures given to her prior to the procedure.  Procedure: The area was prepped with alcohol and dried with a clean gauze. Using a clean technique, the botulinum toxin was diluted with 1.25 cc of preservative-free normal saline which was slowly injected with an 18 gauge needle in a tuberculin syringes.  A 32 gauge needles were then used to inject the botulinum toxin. This mixture allow for an aliquot of 5 units per 0.1 cc in each injection site.    Subsequently the mixture was injected in the glabellar and forehead area with preservation of the temporal branch to the lateral eyebrow as well as into each lateral canthal area  beginning from the lateral orbital rim medial to the zygomaticus major in 3 separate areas. A total of 24 Units of botulinum toxin was used. The forehead and glabellar area was injected with care to inject intramuscular only while holding pressure on the supratrochlear vessels in each area during each injection on either side of the medial corrugators. The injection proceeded vertically superiorly to the medial 2/3 of the frontalis muscle and superior 2/3 of the lateral frontalis, again with preservation of the frontal branch. No complications were noted. Light pressure was held for 5 minutes. She was instructed explicitly in post-operative care.  Botox LOT:  [id number] EXP:  7/23

## 2019-03-08 ENCOUNTER — Ambulatory Visit: Payer: Medicare Other

## 2019-03-29 ENCOUNTER — Ambulatory Visit: Payer: Medicare Other

## 2019-04-04 ENCOUNTER — Ambulatory Visit: Payer: Medicare Other

## 2019-04-07 ENCOUNTER — Telehealth: Payer: Self-pay | Admitting: Plastic Surgery

## 2019-04-07 NOTE — Telephone Encounter (Signed)
Patient called to confirm appt and wanted to make sure it was okay to get botox since she has had both covid vaccines. Her 2nd shot was 2 weeks ago. I advised patient we would only call her if we needed to reschedule the appt due to it not being okay.

## 2019-04-08 NOTE — Telephone Encounter (Signed)
Spoke with Dr. Ulice Bold on (04/07/19) regarding the patient's message below, and she stated to wait 1 week after having the vaccine.//AB/CMA

## 2019-04-10 ENCOUNTER — Other Ambulatory Visit: Payer: Self-pay

## 2019-04-10 ENCOUNTER — Encounter: Payer: Self-pay | Admitting: Plastic Surgery

## 2019-04-10 ENCOUNTER — Ambulatory Visit (INDEPENDENT_AMBULATORY_CARE_PROVIDER_SITE_OTHER): Payer: Self-pay | Admitting: Plastic Surgery

## 2019-04-10 VITALS — BP 118/79 | HR 71 | Temp 97.3°F | Ht 66.0 in | Wt 150.2 lb

## 2019-04-10 DIAGNOSIS — Z719 Counseling, unspecified: Secondary | ICD-10-CM

## 2019-04-10 MED ORDER — LIDOCAINE-PRILOCAINE 2.5-2.5 % EX CREA: 1.0000 "application " | TOPICAL_CREAM | CUTANEOUS | 0 refills | Status: DC | PRN

## 2019-04-10 NOTE — Progress Notes (Signed)
Botulinum Toxin Procedure Note  Procedure: Cosmetic botulinum toxin  Pre-operative Diagnosis: Dynamic rhytides  Post-operative Diagnosis: Same  Complications:  None  Brief history: The patient desires botulinum toxin injection of her forehead. I discussed with the patient this proposed procedure of botulinum toxin injections, which is customized depending on the particular needs of the patient. It is performed on facial rhytids as a temporary correction. The alternatives were discussed with the patient. The risks were addressed including bleeding, scarring, infection, damage to deeper structures, asymmetry, and chronic pain, which may occur infrequently after a procedure. The individual's choice to undergo a surgical procedure is based on the comparison of risks to potential benefits. Other risks include unsatisfactory results, brow ptosis, eyelid ptosis, allergic reaction, temporary paralysis, which should go away with time, bruising, blurring disturbances and delayed healing. Botulinum toxin injections do not arrest the aging process or produce permanent tightening of the eyelid.  Operative intervention maybe necessary to maintain the results of a blepharoplasty or botulinum toxin. The patient understands and wishes to proceed.  Procedure: The area was prepped with alcohol and dried with a clean gauze. Using a clean technique, the botulinum toxin was diluted with 1.25 cc of preservative-free normal saline which was slowly injected with an 18 gauge needle in a tuberculin syringes.  A 32 gauge needles were then used to inject the botulinum toxin. This mixture allow for an aliquot of 5 units per 0.1 cc in each injection site.    Subsequently the mixture was injected in the glabellar and forehead area with preservation of the temporal branch to the lateral eyebrow as well as into each lateral canthal area beginning from the lateral orbital rim medial to the zygomaticus major in 3 separate areas. A total  of 30 Units of botulinum toxin was used. The forehead and glabellar area was injected with care to inject intramuscular only while holding pressure on the supratrochlear vessels in each area during each injection on either side of the medial corrugators. The injection proceeded vertically superiorly to the medial 2/3 of the frontalis muscle and superior 2/3 of the lateral frontalis, again with preservation of the frontal branch. No complications were noted. Light pressure was held for 5 minutes. She was instructed explicitly in post-operative care.  Botox LOT:  [id number] [id number] EXP:  9/23  

## 2019-05-22 ENCOUNTER — Encounter: Payer: Self-pay | Admitting: Plastic Surgery

## 2019-05-22 ENCOUNTER — Other Ambulatory Visit: Payer: Self-pay

## 2019-05-22 ENCOUNTER — Ambulatory Visit (INDEPENDENT_AMBULATORY_CARE_PROVIDER_SITE_OTHER): Payer: Self-pay | Admitting: Plastic Surgery

## 2019-05-22 VITALS — BP 115/82 | HR 74 | Temp 98.2°F | Ht 66.0 in | Wt 150.0 lb

## 2019-05-22 DIAGNOSIS — Z719 Counseling, unspecified: Secondary | ICD-10-CM

## 2019-05-22 NOTE — Progress Notes (Signed)
Preoperative Dx: hyperpigmentation of face  Postoperative Dx:  same  Procedure: laser to face   Anesthesia: none  Description of Procedure:  Risks and complications were explained to the patient. Consent was confirmed and signed. Time out was called and all information was confirmed to be correct. The area  area was prepped with alcohol and wiped dry. The IPL laser was set at 7.8 J/cm2. The face was lasered. The patient tolerated the procedure well and there were no complications. The patient is to follow up in 4 weeks.

## 2019-05-29 ENCOUNTER — Other Ambulatory Visit: Payer: Self-pay | Admitting: Obstetrics

## 2019-05-29 DIAGNOSIS — Z1231 Encounter for screening mammogram for malignant neoplasm of breast: Secondary | ICD-10-CM

## 2019-06-26 ENCOUNTER — Other Ambulatory Visit: Payer: Self-pay

## 2019-06-26 ENCOUNTER — Encounter: Payer: Self-pay | Admitting: Plastic Surgery

## 2019-06-26 ENCOUNTER — Ambulatory Visit (INDEPENDENT_AMBULATORY_CARE_PROVIDER_SITE_OTHER): Payer: Self-pay | Admitting: Plastic Surgery

## 2019-06-26 VITALS — BP 123/84 | HR 75 | Temp 97.1°F | Ht 66.0 in | Wt 150.0 lb

## 2019-06-26 DIAGNOSIS — Z719 Counseling, unspecified: Secondary | ICD-10-CM

## 2019-06-26 NOTE — Progress Notes (Signed)
Preoperative Dx: Hyperpigmentation of face  Postoperative Dx:  same  Procedure: laser to face  Anesthesia: none  Description of Procedure:  Risks and complications were explained to the patient. Consent was confirmed and signed. Time out was called and all information was confirmed to be correct. The area  area was prepped with alcohol and wiped dry. The IPL laser was set at 7.8 J/cm2. The face was lasered. The patient tolerated the procedure well and there were no complications. The patient is to follow up in 4 weeks. She has started on the ZO system kit.

## 2019-07-09 ENCOUNTER — Ambulatory Visit: Payer: Medicare PPO

## 2019-07-13 ENCOUNTER — Ambulatory Visit
Admission: RE | Admit: 2019-07-13 | Discharge: 2019-07-13 | Disposition: A | Discharge status: Home / Self Care | Payer: Medicare PPO | Source: Ambulatory Visit | Attending: Obstetrics | Admitting: Obstetrics

## 2019-07-13 ENCOUNTER — Other Ambulatory Visit: Payer: Self-pay

## 2019-07-13 DIAGNOSIS — Z1231 Encounter for screening mammogram for malignant neoplasm of breast: Secondary | ICD-10-CM

## 2019-07-21 ENCOUNTER — Other Ambulatory Visit: Payer: Self-pay

## 2019-07-21 ENCOUNTER — Ambulatory Visit (INDEPENDENT_AMBULATORY_CARE_PROVIDER_SITE_OTHER): Payer: Self-pay | Admitting: Plastic Surgery

## 2019-07-21 ENCOUNTER — Encounter: Payer: Self-pay | Admitting: Plastic Surgery

## 2019-07-21 VITALS — BP 107/68 | HR 74 | Temp 97.3°F

## 2019-07-21 DIAGNOSIS — Z719 Counseling, unspecified: Secondary | ICD-10-CM

## 2019-07-21 NOTE — Progress Notes (Signed)

## 2019-08-21 ENCOUNTER — Encounter: Payer: Self-pay | Admitting: Plastic Surgery

## 2019-08-21 ENCOUNTER — Ambulatory Visit (INDEPENDENT_AMBULATORY_CARE_PROVIDER_SITE_OTHER): Payer: Self-pay | Admitting: Plastic Surgery

## 2019-08-21 ENCOUNTER — Other Ambulatory Visit: Payer: Self-pay

## 2019-08-21 VITALS — HR 72 | Temp 97.9°F

## 2019-08-21 DIAGNOSIS — M05751 Rheumatoid arthritis with rheumatoid factor of right hip without organ or systems involvement: Secondary | ICD-10-CM

## 2019-08-21 DIAGNOSIS — M05752 Rheumatoid arthritis with rheumatoid factor of left hip without organ or systems involvement: Secondary | ICD-10-CM

## 2019-08-21 DIAGNOSIS — Z719 Counseling, unspecified: Secondary | ICD-10-CM

## 2019-08-21 NOTE — Progress Notes (Signed)
Preoperative Dx: Hyperpigmentation of face  Postoperative Dx:  same  Procedure: laser to face  Anesthesia: none  Description of Procedure:  Risks and complications were explained to the patient. Consent was confirmed and signed. Time out was called and all information was confirmed to be correct. The area  area was prepped with alcohol and wiped dry. The IPL laser was set at 7.0 J/cm2. The face was lasered. The patient tolerated the procedure well and there were no complications. The patient is to follow up in 4 weeks. We will also set up a consultation for her to see Venetia Night for more information about treating the hyperpigmentation.  She has started the ZO starter kit and is so far happy with it.

## 2019-10-02 ENCOUNTER — Other Ambulatory Visit: Payer: Self-pay

## 2019-10-02 ENCOUNTER — Encounter: Payer: Self-pay | Admitting: Plastic Surgery

## 2019-10-02 ENCOUNTER — Ambulatory Visit (INDEPENDENT_AMBULATORY_CARE_PROVIDER_SITE_OTHER): Payer: Self-pay | Admitting: Plastic Surgery

## 2019-10-02 VITALS — BP 131/86 | HR 71 | Temp 97.8°F

## 2019-10-02 DIAGNOSIS — R238 Other skin changes: Secondary | ICD-10-CM

## 2019-10-02 DIAGNOSIS — L819 Disorder of pigmentation, unspecified: Secondary | ICD-10-CM

## 2019-10-02 NOTE — Progress Notes (Signed)
Subjective:     Patient ID: Brandi Hines, female    DOB: 1950-05-09, 69 y.o.   MRN: [id number]  Chief Complaint  Patient presents with  . Consult  . Skin Problem    HPI: The patient is a 69 y.o. female who presents for skin care consultation.  Her primary concern is hyperpigmentation.  She reports she has been doing IPL and has seen improvement in the hyperpigmentation but would like to do more to try to help improve it.  She has been using the ZO skin care starter kit consisting of exfoliating cleanser, exfoliating polish, complexion renewal pads, and daily power defense.  She has been pleased with these products and would like to step it up to now address the hyperpigmentation. Reports she wears sunscreen daily and also uses Sente Neck Firming Cream. Applies as part of her morning routine.  Review of Systems  Constitutional: Negative for chills and fever.  Skin: Negative for pallor, rash and wound.       Dark spots on face, especially on the cheeks.     Objective:   Vital Signs BP 131/86 (BP Location: Left Arm, Patient Position: Sitting, Cuff Size: Normal)   Pulse 71   Temp 97.8 F (36.6 C) (Oral)   SpO2 97%  Vital Signs and Nursing Note Reviewed  Physical Exam Constitutional:      General: She is not in acute distress.    Appearance: Normal appearance. She is normal weight. She is not ill-appearing.  HENT:     Head: Normocephalic and atraumatic.  Eyes:     Extraocular Movements: Extraocular movements intact.  Pulmonary:     Effort: Pulmonary effort is normal.  Musculoskeletal:     Cervical back: Normal range of motion.  Skin:    General: Skin is warm and dry.     Coloration: Skin is not jaundiced or pale.     Findings: No rash.     Comments: Scattered spots of hyperpigmentation on face with concentration on cheeks and forhead. Minimal fine lines and wrinkles; mostly around eyes. Nice uniform smooth texture. General signs of facial aging including under eyes.    Neurological:     General: No focal deficit present.     Mental Status: She is alert and oriented to person, place, and time.  Psychiatric:        Mood and Affect: Mood normal.        Behavior: Behavior normal.        Thought Content: Thought content normal.        Judgment: Judgment normal.             Assessment/Plan:     ICD-10-CM   1. Hyperpigmentation of skin  L81.9   2. Facial aging  R23.8    Ms. Barnfield overall has very nice skin. She has some areas of hyperpigmentation on her face, concentrated on her forehead and cheeks that has been improved with IPL laser treatments but remain. Nice smooth texture of skin with minimal fine lines and wrinkles; mostly around eyes. She has been using the ZO 'Daily Skin Care program' consisting of Exfoliating Cleanser, Exfoliating [place]), Complexion Renewal Pads, and Daily Power Defense. She is also using Sente Neck Firming Cream and Elta MD Tinted Sunscreen SPF 77. She has been happy with these products and is ready to step it up to address the hyperpigmentation.  Recommend continuing with her current product regimen and adding the following products.  1. Brightalive - AM & PM  2. Retinol Skin Brightner 1% - AM 3. Wrinkle + Texture Repair - PM - Start 3x per week and increase as tolerated until using daily 4.  Growth Factor Eye Serum - AM & PM  After 3-4 months we can begin to utilize a hydroquinone if we're not seeing enough of a change to the hyperpigmentation.    Purchased today (full size products): Exfoliating Cleanser, Exfoliating Polish, Complexion Renewal Pads, Wrinkle + Texture Repair, Growth Factor Eye Serum, Sente Neck Firming Cream, Elta MD Tinted Sunscreen SPF 40. Ordered for her today - Full size (not in stock): Daily Power Defense, BrightAlive, Retinol Skin Brightner 1%  Will call once her products come in. Follow up in 4 weeks. Call office with any questions/concerns.    Threasa Heads, PA-C 10/02/2019, 4:12 PM

## 2019-10-23 ENCOUNTER — Ambulatory Visit (INDEPENDENT_AMBULATORY_CARE_PROVIDER_SITE_OTHER): Payer: Self-pay | Admitting: Plastic Surgery

## 2019-10-23 ENCOUNTER — Other Ambulatory Visit: Payer: Self-pay

## 2019-10-23 ENCOUNTER — Encounter: Payer: Self-pay | Admitting: Plastic Surgery

## 2019-10-23 VITALS — BP 132/83 | HR 97 | Temp 97.9°F

## 2019-10-23 DIAGNOSIS — Z719 Counseling, unspecified: Secondary | ICD-10-CM

## 2019-10-23 NOTE — Progress Notes (Signed)

## 2019-12-18 ENCOUNTER — Encounter: Payer: Medicare PPO | Admitting: Plastic Surgery

## 2020-01-19 ENCOUNTER — Ambulatory Visit (INDEPENDENT_AMBULATORY_CARE_PROVIDER_SITE_OTHER): Payer: Self-pay | Admitting: Plastic Surgery

## 2020-01-19 ENCOUNTER — Encounter: Payer: Self-pay | Admitting: Plastic Surgery

## 2020-01-19 ENCOUNTER — Other Ambulatory Visit: Payer: Self-pay

## 2020-01-19 VITALS — BP 127/84 | HR 66 | Temp 97.6°F

## 2020-01-19 DIAGNOSIS — Z719 Counseling, unspecified: Secondary | ICD-10-CM

## 2020-01-19 NOTE — Progress Notes (Signed)
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 I discussed with the patient this proposed procedure of botulinum toxin injections, which is customized depending on the particular needs of the patient. It is performed on facial rhytids as a temporary correction. The alternatives were discussed with the patient. The risks were addressed including bleeding, scarring, infection, damage to deeper structures, asymmetry, and chronic pain, which may occur infrequently after a procedure. The individual's choice to undergo a surgical procedure is based on the comparison of risks to potential benefits. Other risks include unsatisfactory results, brow ptosis, eyelid ptosis, allergic reaction, temporary paralysis, which should go away with time, bruising, blurring disturbances and delayed healing. Botulinum toxin injections do not arrest the aging process or produce permanent tightening of the eyelid.  Operative intervention maybe necessary to maintain the results of a blepharoplasty or botulinum toxin. The patient understands and wishes to proceed.  Procedure: The area was prepped with alcohol and dried with a clean gauze. Using a clean technique, the botulinum toxin was diluted with 1.25 cc of preservative-free normal saline which was slowly injected with an 18 gauge needle in a tuberculin syringes.  A 32 gauge needles were then used to inject the botulinum toxin. This mixture allow for an aliquot of 5 units per 0.1 cc in each injection site.    Subsequently the mixture was injected in the glabellar and forehead area with preservation of the temporal branch to the lateral eyebrow as well as into each lateral canthal area beginning from the lateral  orbital rim medial to the zygomaticus major in 3 separate areas. A total of 30 Units of botulinum toxin was used. The forehead and glabellar area was injected with care to inject intramuscular only while holding pressure on the supratrochlear vessels in each area during each injection on either side of the medial corrugators. The injection proceeded vertically superiorly to the medial 2/3 of the frontalis muscle and superior 2/3 of the lateral frontalis, again with preservation of the frontal branch.  The midface area was injected at the 3 sub-regions of the mid-face: zygomaticomalar region, anteromedial cheek region, and submalar region for a total of one syringe on each side of the face. The technique used was serial puncture with equal injections in the 3 sub-regions: the zygomaticomalar region, the anteromedial cheek, and the submalar region.  No complications were noted. Light pressure was held for 5 minutes. She was instructed explicitly in post-operative care.  Botox LOT:  [id number] [id number] EXP:  3/24  J. Neomia Dear LOT: [id number]

## 2020-03-22 ENCOUNTER — Other Ambulatory Visit: Payer: Self-pay

## 2020-03-22 ENCOUNTER — Encounter: Payer: Self-pay | Admitting: Plastic Surgery

## 2020-03-22 ENCOUNTER — Ambulatory Visit (INDEPENDENT_AMBULATORY_CARE_PROVIDER_SITE_OTHER): Payer: Self-pay | Admitting: Plastic Surgery

## 2020-03-22 VITALS — BP 114/77 | HR 69 | Temp 97.3°F | Ht 66.0 in | Wt 150.0 lb

## 2020-03-22 DIAGNOSIS — Z719 Counseling, unspecified: Secondary | ICD-10-CM

## 2020-03-22 NOTE — Progress Notes (Signed)
Preoperative Dx: Hyperpigmentation of face  Postoperative Dx:  same  Procedure: laser to face  Anesthesia: none  Description of Procedure:  Risks and complications were explained to the patient. Consent was confirmed and signed. Time out was called and all information was confirmed to be correct. The area  area was prepped with alcohol and wiped dry. The IPL laser was set at 7.2 J/cm2. The face was lasered. The patient tolerated the procedure well and there were no complications. The patient is to follow up in 4 weeks.   Botulinum Toxin Procedure Note  Procedure: Cosmetic botulinum toxin   Pre-operative Diagnosis: Dynamic rhytides  Post-operative Diagnosis: Same  Complications:  None  Brief history: The patient desires botulinum toxin injection of her forehead. I discussed with the patient this proposed procedure of botulinum toxin injections, which is customized depending on the particular needs of the patient. It is performed on facial rhytids as a temporary correction. The alternatives were discussed with the patient. The risks were addressed including bleeding, scarring, infection, damage to deeper structures, asymmetry, and chronic pain, which may occur infrequently after a procedure. The individual's choice to undergo a surgical procedure is based on the comparison of risks to potential benefits. Other risks include unsatisfactory results, brow ptosis, eyelid ptosis, allergic reaction, temporary paralysis, which should go away with time, bruising, blurring disturbances and delayed healing. Botulinum toxin injections do not arrest the aging process or produce permanent tightening of the eyelid.  Operative intervention maybe necessary to maintain the results of a blepharoplasty or botulinum toxin. The patient understands and wishes to proceed.  Procedure: The area was prepped with alcohol and dried with a clean gauze. Using a clean technique, the botulinum toxin was diluted with 1.25 cc  of preservative-free normal saline which was slowly injected with an 18 gauge needle in a tuberculin syringes.  A 32 gauge needles were then used to inject the botulinum toxin. This mixture allow for an aliquot of 5 units per 0.1 cc in each injection site.    Subsequently the mixture was injected in the glabellar and forehead area with preservation of the temporal branch to the lateral eyebrow as well as into each lateral canthal area beginning from the lateral orbital rim medial to the zygomaticus major in 3 separate areas. A total of 32 Units of botulinum toxin was used. The forehead and glabellar area was injected with care to inject intramuscular only while holding pressure on the supratrochlear vessels in each area during each injection on either side of the medial corrugators. The injection proceeded vertically superiorly to the medial 2/3 of the frontalis muscle and superior 2/3 of the lateral frontalis, again with preservation of the frontal branch.  No complications were noted. Light pressure was held for 5 minutes. She was instructed explicitly in post-operative care.  Botox LOT:  [id number] EXP:  4/24

## 2020-05-11 ENCOUNTER — Other Ambulatory Visit: Payer: Self-pay

## 2020-05-11 ENCOUNTER — Emergency Department (HOSPITAL_COMMUNITY)
Admission: EM | Admit: 2020-05-11 | Discharge: 2020-05-12 | Disposition: A | Discharge status: Home / Self Care | Payer: Medicare PPO | Attending: Emergency Medicine | Admitting: Emergency Medicine

## 2020-05-11 ENCOUNTER — Emergency Department (HOSPITAL_COMMUNITY): Payer: Medicare PPO

## 2020-05-11 ENCOUNTER — Encounter (HOSPITAL_COMMUNITY): Payer: Self-pay | Admitting: Emergency Medicine

## 2020-05-11 DIAGNOSIS — W19XXXA Unspecified fall, initial encounter: Secondary | ICD-10-CM | POA: Diagnosis not present

## 2020-05-11 DIAGNOSIS — R519 Headache, unspecified: Secondary | ICD-10-CM | POA: Insufficient documentation

## 2020-05-11 DIAGNOSIS — X58XXXA Exposure to other specified factors, initial encounter: Secondary | ICD-10-CM | POA: Insufficient documentation

## 2020-05-11 DIAGNOSIS — I951 Orthostatic hypotension: Secondary | ICD-10-CM | POA: Diagnosis not present

## 2020-05-11 DIAGNOSIS — Z96643 Presence of artificial hip joint, bilateral: Secondary | ICD-10-CM | POA: Diagnosis not present

## 2020-05-11 DIAGNOSIS — T50905A Adverse effect of unspecified drugs, medicaments and biological substances, initial encounter: Secondary | ICD-10-CM

## 2020-05-11 DIAGNOSIS — T887XXA Unspecified adverse effect of drug or medicament, initial encounter: Secondary | ICD-10-CM | POA: Diagnosis not present

## 2020-05-11 DIAGNOSIS — R42 Dizziness and giddiness: Secondary | ICD-10-CM | POA: Diagnosis present

## 2020-05-11 NOTE — ED Triage Notes (Addendum)
Pt presents to Ed POV. Pt c/o fall x2. Pt reports that she was dizzy fell, got up, walked to the bench, then got dizzy again and fell again. Pt reports that she hit her head both times. Pt denies LOC, denies blood thinners. Reports pain only in head

## 2020-05-11 NOTE — ED Provider Notes (Signed)
Patient placed in Quick Look pathway, seen and evaluated   Chief Complaint headache after fall  HPI:   Pt reports she has been really wobbly today.  Pt reports she fell twice and hit the back of her head.  Pt complains of a headache unrelieved by tyleno or ibuprofen   ROS: no chest pain  Physical Exam:   Gen: No distress  Neuro: Awake and Alert  Skin: Warm    Focused Exam: tender occipital scalp   Initiation of care has begun. The patient has been counseled on the process, plan, and necessity for staying for the completion/evaluation, and the remainder of the medical screening examination   Osie Cheeks 05/11/20 2113    Gwyneth Sprout, MD 05/11/20 2302

## 2020-05-12 ENCOUNTER — Encounter (HOSPITAL_COMMUNITY): Payer: Self-pay | Admitting: Emergency Medicine

## 2020-05-12 LAB — I-STAT CHEM 8, ED
BUN: 11 mg/dL (ref 8–23)
Calcium, Ion: 1.16 mmol/L (ref 1.15–1.40)
Chloride: 103 mmol/L (ref 98–111)
Creatinine, Ser: 0.7 mg/dL (ref 0.44–1.00)
Glucose, Bld: 81 mg/dL (ref 70–99)
HCT: 46 % (ref 36.0–46.0)
Hemoglobin: 15.6 g/dL — ABNORMAL HIGH (ref 12.0–15.0)
Potassium: 3.5 mmol/L (ref 3.5–5.1)
Sodium: 142 mmol/L (ref 135–145)
TCO2: 28 mmol/L (ref 22–32)

## 2020-05-12 LAB — TROPONIN I (HIGH SENSITIVITY): Troponin I (High Sensitivity): 5 ng/L (ref <18)

## 2020-05-12 MED ORDER — SODIUM CHLORIDE 0.9 % IV BOLUS
500.0000 mL | Freq: Once | INTRAVENOUS | Status: AC
  Administered 2020-05-12: 500 mL via INTRAVENOUS

## 2020-05-12 NOTE — ED Provider Notes (Signed)
[facility] EMERGENCY DEPARTMENT Provider Note   CSN: [id number] Arrival date & time: 05/11/20  2051     History Chief Complaint  Patient presents with  . Fall    Brandi Hines is a 70 y.o. female.  The history is provided by the patient.  Fall This is a new problem. The current episode started 12 to 24 hours ago. The problem occurs rarely. The problem has been resolved. Pertinent negatives include no chest pain, no abdominal pain and no shortness of breath. Nothing aggravates the symptoms. Nothing relieves the symptoms. The treatment provided no relief.  Patient presents with fall x 2 and lightheadedness after being in the lobby with a patient yesterday and then not sleeping and then felt headache and took fiorcet and then was also upset because of the lack of sleep and what was going on with her family member then took klonopin without sleep nor food and then felt lightheaded and fell x 2.  No weakness no numbness.  No CP no SOB.  No n/v/d.       Past Medical History:  Diagnosis Date  . Arthritis   . Colitis   . Complication of anesthesia   . Interstitial cystitis   . Kidney stones   . PONV (postoperative nausea and vomiting)   . UTI (lower urinary tract infection)     Patient Active Problem List   Diagnosis Date Noted  . Encounter for counseling 12/20/2017  . OA (osteoarthritis) of hip 12/12/2016  . Rheumatoid arthritis (HCC) 06/07/2016  . Bilateral hip pain 04/26/2016  . Weakness of both lower extremities 04/26/2016  . Abdominal pain, left lower quadrant 01/08/2013  . Diarrhea 01/08/2013  . Rectal bleeding 01/08/2013    Past Surgical History:  Procedure Laterality Date  . ABDOMINAL HYSTERECTOMY     partial  . APPENDECTOMY  1991  . COLONOSCOPY N/A 02/12/2013   Procedure: COLONOSCOPY;  Surgeon: Hart Carwin, MD;  Location: [facility] ENDOSCOPY;  Service: Endoscopy;  Laterality: N/A;  . JOINT REPLACEMENT    . PARTIAL HYSTERECTOMY  1990  . 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 TOTAL HIP ARTHROPLASTY Right 06/26/2017   Procedure: RIGHT TOTAL HIP ARTHROPLASTY ANTERIOR APPROACH;  Surgeon: Ollen Gross, MD;  Location: [facility] ORS;  Service: Orthopedics;  Laterality: Right;     OB History   No obstetric history on file.     Family History  Problem Relation Age of Onset  . Prostate cancer Father   . Colon cancer Neg Hx   . Colon polyps Neg Hx     Social History   Tobacco Use  . Smoking status: Never Smoker  . Smokeless tobacco: Never Used  Vaping Use  . Vaping Use: Never used  Substance Use Topics  . Alcohol use: No  . Drug use: No    Home Medications Prior to Admission medications   Medication Sig Start Date End Date Taking? Authorizing Provider  butalbital-acetaminophen-caffeine (FIORICET, ESGIC) 50-325-40 MG tablet Take 1-2 tablets by mouth every 6 (six) hours as needed for migraine.  10/10/16   [provider]  estradiol (ESTRACE) 0.5 MG tablet Take 0.5 mg by mouth daily. 05/11/17   [provider]  FLUoxetine (PROZAC) 40 MG capsule Take 40 mg by mouth daily.    [provider]  lidocaine-prilocaine (EMLA) cream Apply 1 application topically as  needed. Apply to face 40 min prior to procedure 04/10/19   Dillingham, Alena Bills, DO  LORazepam (ATIVAN) 0.5 MG tablet Take 0.5 mg by mouth 2 (two) times daily as needed for anxiety.    [provider]  zolpidem (AMBIEN) 10 MG tablet Take 5-10 mg by mouth at bedtime as needed for sleep. 06/04/17   [provider]    Allergies    Patient has no known allergies.  Review of Systems   Review of Systems  Constitutional: Negative for fatigue and fever.  HENT: Negative for congestion.   Eyes: Negative for visual disturbance.  Respiratory: Negative for shortness of breath.   Cardiovascular:  Negative for chest pain.  Gastrointestinal: Negative for abdominal pain.  Genitourinary: Negative for difficulty urinating and dysuria.  Musculoskeletal: Negative for back pain and neck pain.  Skin: Negative for rash.  Neurological: Positive for light-headedness. Negative for syncope, facial asymmetry, speech difficulty, weakness and numbness.  Psychiatric/Behavioral: Negative for agitation.  All other systems reviewed and are negative.   Physical Exam Updated Vital Signs BP (!) 150/94   Pulse (!) 54   Temp 97.6 F (36.4 C) (Oral)   Resp (!) 23   SpO2 96%   Physical Exam Vitals and nursing note reviewed.  Constitutional:      General: She is not in acute distress.    Appearance: Normal appearance.  HENT:     Head: Normocephalic and atraumatic.     Nose: Nose normal.     Mouth/Throat:     Mouth: Mucous membranes are moist.  Eyes:     Extraocular Movements: Extraocular movements intact.     Conjunctiva/sclera: Conjunctivae normal.     Pupils: Pupils are equal, round, and reactive to light.  Cardiovascular:     Rate and Rhythm: Normal rate and regular rhythm.     Pulses: Normal pulses.     Heart sounds: Normal heart sounds.  Pulmonary:     Effort: Pulmonary effort is normal.     Breath sounds: Normal breath sounds.  Abdominal:     General: Abdomen is flat. Bowel sounds are normal.     Palpations: Abdomen is soft.     Tenderness: There is no abdominal tenderness. There is no guarding.  Musculoskeletal:        General: Normal range of motion.     Cervical back: Normal range of motion and neck supple.  Skin:    General: Skin is warm and dry.     Capillary Refill: Capillary refill takes less than 2 seconds.  Neurological:     General: No focal deficit present.     Mental Status: She is alert and oriented to person, place, and time.     Cranial Nerves: No cranial nerve deficit.     Deep Tendon Reflexes: Reflexes normal.  Psychiatric:        Mood and Affect: Mood  normal.        Behavior: Behavior normal.     ED Results / Procedures / Treatments   Labs (all labs ordered are listed, but only abnormal results are displayed) Results for orders placed or performed during the hospital encounter of 05/11/20  I-stat chem 8, ED (not at [facility] or [facility])  Result Value Ref Range   Sodium 142 135 - 145 mmol/L   Potassium 3.5 3.5 - 5.1 mmol/L   Chloride 103 98 - 111 mmol/L   BUN 11 8 - 23 mg/dL   Creatinine, Ser 5.64 0.44 - 1.00 mg/dL  Glucose, Bld 81 70 - 99 mg/dL   Calcium, Ion 9.64 3.83 - 1.40 mmol/L   TCO2 28 22 - 32 mmol/L   Hemoglobin 15.6 (H) 12.0 - 15.0 g/dL   HCT 81.8 40.3 - 75.4 %   CT Head Wo Contrast  Result Date: 05/11/2020 CLINICAL DATA:  Status post fall. EXAM: CT HEAD WITHOUT CONTRAST TECHNIQUE: Contiguous axial images were obtained from the base of the skull through the vertex without intravenous contrast. COMPARISON:  None. FINDINGS: Brain: No evidence of large-territorial acute infarction. No parenchymal hemorrhage. No mass lesion. No extra-axial collection. No mass effect or midline shift. No hydrocephalus. Basilar cisterns are patent. Vascular: No hyperdense vessel. Skull: No acute fracture or focal lesion. Sinuses/Orbits: Paranasal sinuses and mastoid air cells are clear. The orbits are unremarkable. Other: None. IMPRESSION: No acute intracranial abnormality. Electronically Signed   By: Tish Frederickson M.D.   On: 05/11/2020 21:49    EKG EKG Interpretation  Date/Time:  Wednesday Verenice Westrich 06 2022 21:17:28 EDT Ventricular Rate:  69 PR Interval:  152 QRS Duration: 82 QT Interval:  364 QTC Calculation: 390 R Axis:   24 Text Interpretation: Normal sinus rhythm Low voltage QRS Confirmed by Nicanor Alcon, Clifford Coudriet ([id number]) on 05/12/2020 4:00:12 AM   Radiology CT Head Wo Contrast  Result Date: 05/11/2020 CLINICAL DATA:  Status post fall. EXAM: CT HEAD WITHOUT CONTRAST TECHNIQUE: Contiguous axial images were obtained from the base of the skull through  the vertex without intravenous contrast. COMPARISON:  None. FINDINGS: Brain: No evidence of large-territorial acute infarction. No parenchymal hemorrhage. No mass lesion. No extra-axial collection. No mass effect or midline shift. No hydrocephalus. Basilar cisterns are patent. Vascular: No hyperdense vessel. Skull: No acute fracture or focal lesion. Sinuses/Orbits: Paranasal sinuses and mastoid air cells are clear. The orbits are unremarkable. Other: None. IMPRESSION: No acute intracranial abnormality. Electronically Signed   By: Tish Frederickson M.D.   On: 05/11/2020 21:49    Procedures Procedures   Medications Ordered in ED Medications  sodium chloride 0.9 % bolus 500 mL (0 mLs Intravenous Stopped 05/12/20 0547)    ED Course  I have reviewed the triage vital signs and the nursing notes.  Pertinent labs & imaging results that were available during my care of the patient were reviewed by me and considered in my medical decision making (see chart for details).    Mild orthostasis likely from poor PO intake.  Also took 2 medications together on empty stomach and I suspect this contributed to symptoms more than anything.  No taking benzos and other medications.  Drink proper liquids at home and sleep.    Eilleen Acevedo was evaluated in Emergency Department on 05/12/2020 for the symptoms described in the history of present illness. She was evaluated in the context of the global COVID-19 pandemic, which necessitated consideration that the patient might be at risk for infection with the SARS-CoV-2 virus that causes COVID-19. Institutional protocols and algorithms that pertain to the evaluation of patients at risk for COVID-19 are in a state of rapid change based on information released by regulatory bodies including the CDC and federal and state organizations. These policies and algorithms were followed during the patient's care in the ED.  Final Clinical Impression(s) / ED Diagnoses Final diagnoses:   Orthostasis  Adverse effect of drug, initial encounter  Fall, initial encounter   Return for intractable cough, coughing up blood, fevers >100.4 unrelieved by medication, shortness of breath, intractable vomiting, chest pain, shortness of breath, weakness, numbness, changes  in speech, facial asymmetry, abdominal pain, passing out, Inability to tolerate liquids or food, cough, altered mental status or any concerns. No signs of systemic illness or infection. The patient is nontoxic-appearing on exam and vital signs are within normal limits.  I have reviewed the triage vital signs and the nursing notes. Pertinent labs & imaging results that were available during my care of the patient were reviewed by me and considered in my medical decision making (see chart for details). After history, exam, and medical workup I feel the patient has been appropriately medically screened and is safe for discharge home. Pertinent diagnoses were discussed with the patient. Patient was given return precautions. Rx / DC Orders ED Discharge Orders    None       Finleigh Cheong, MD 05/12/20 4540

## 2020-06-16 ENCOUNTER — Other Ambulatory Visit: Payer: Self-pay | Admitting: Obstetrics

## 2020-06-16 DIAGNOSIS — Z1231 Encounter for screening mammogram for malignant neoplasm of breast: Secondary | ICD-10-CM

## 2020-06-21 ENCOUNTER — Other Ambulatory Visit: Payer: Self-pay

## 2020-06-21 ENCOUNTER — Ambulatory Visit (INDEPENDENT_AMBULATORY_CARE_PROVIDER_SITE_OTHER): Payer: Self-pay | Admitting: Plastic Surgery

## 2020-06-21 DIAGNOSIS — Z719 Counseling, unspecified: Secondary | ICD-10-CM

## 2020-06-21 NOTE — Progress Notes (Signed)
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 I discussed with the patient this proposed procedure of botulinum toxin injections, which is customized depending on the particular needs of the patient. It is performed on facial rhytids as a temporary correction. The alternatives were discussed with the patient. The risks were addressed including bleeding, scarring, infection, damage to deeper structures, asymmetry, and chronic pain, which may occur infrequently after a procedure. The individual's choice to undergo a surgical procedure is based on the comparison of risks to potential benefits. Other risks include unsatisfactory results, brow ptosis, eyelid ptosis, allergic reaction, temporary paralysis, which should go away with time, bruising, blurring disturbances and delayed healing. Botulinum toxin injections do not arrest the aging process or produce permanent tightening of the eyelid.  Operative intervention maybe necessary to maintain the results of a blepharoplasty or botulinum toxin. The patient understands and wishes to proceed.  Procedure: The area was prepped with alcohol and dried with a clean gauze. Using a clean technique, the botulinum toxin was diluted with 1.25 cc of preservative-free normal saline which was slowly injected with an 18 gauge needle in a tuberculin syringes.  A 32 gauge needles were then used to inject the botulinum toxin. This mixture allow for an aliquot of 5 units per 0.1 cc in each injection site.    Subsequently the mixture was injected in the glabellar and forehead area with preservation of the temporal branch to the lateral eyebrow as well as into each lateral canthal area beginning from the lateral  orbital rim medial to the zygomaticus major in 3 separate areas. A total of 30 Units of botulinum toxin was used. The forehead and glabellar area was injected with care to inject intramuscular only while holding pressure on the supratrochlear vessels in each area during each injection on either side of the medial corrugators. The injection proceeded vertically superiorly to the medial 2/3 of the frontalis muscle and superior 2/3 of the lateral frontalis, again with preservation of the frontal branch.  The midface area was injected at the 3 sub-regions of the mid-face: zygomaticomalar region, anteromedial cheek region, and submalar region for a total of one syringe with 0.5cc on each side of the face. The technique used was serial puncture with equal injections in the 3 sub-regions: the zygomaticomalar region, the anteromedial cheek, and the submalar region.  No complications were noted. Light pressure was held for 5 minutes. She was instructed explicitly in post-operative care.  Botox LOT:  [id number] EXP:  5/25  J. Voluma XC x 2 LOT: [id number] EXP: 2021-02-22

## 2020-08-11 ENCOUNTER — Other Ambulatory Visit: Payer: Self-pay

## 2020-08-11 ENCOUNTER — Ambulatory Visit
Admission: RE | Admit: 2020-08-11 | Discharge: 2020-08-11 | Disposition: A | Discharge status: Home / Self Care | Payer: Medicare PPO | Source: Ambulatory Visit | Attending: Obstetrics | Admitting: Obstetrics

## 2020-08-11 DIAGNOSIS — Z1231 Encounter for screening mammogram for malignant neoplasm of breast: Secondary | ICD-10-CM

## 2020-08-17 ENCOUNTER — Other Ambulatory Visit: Payer: Self-pay | Admitting: Obstetrics

## 2020-08-17 DIAGNOSIS — R928 Other abnormal and inconclusive findings on diagnostic imaging of breast: Secondary | ICD-10-CM

## 2020-08-18 ENCOUNTER — Other Ambulatory Visit: Payer: Medicare PPO

## 2020-08-29 ENCOUNTER — Ambulatory Visit
Admission: RE | Admit: 2020-08-29 | Discharge: 2020-08-29 | Disposition: A | Discharge status: Home / Self Care | Payer: Medicare PPO | Source: Ambulatory Visit | Attending: Obstetrics | Admitting: Obstetrics

## 2020-08-29 ENCOUNTER — Other Ambulatory Visit: Payer: Self-pay | Admitting: Obstetrics

## 2020-08-29 ENCOUNTER — Other Ambulatory Visit: Payer: Self-pay

## 2020-08-29 ENCOUNTER — Ambulatory Visit: Payer: Medicare PPO

## 2020-08-29 ENCOUNTER — Other Ambulatory Visit: Payer: Medicare PPO

## 2020-08-29 DIAGNOSIS — R928 Other abnormal and inconclusive findings on diagnostic imaging of breast: Secondary | ICD-10-CM

## 2020-08-29 DIAGNOSIS — R921 Mammographic calcification found on diagnostic imaging of breast: Secondary | ICD-10-CM

## 2020-09-05 ENCOUNTER — Ambulatory Visit
Admission: RE | Admit: 2020-09-05 | Discharge: 2020-09-05 | Disposition: A | Discharge status: Home / Self Care | Payer: Medicare PPO | Source: Ambulatory Visit | Attending: Obstetrics | Admitting: Obstetrics

## 2020-09-05 ENCOUNTER — Other Ambulatory Visit: Payer: Self-pay

## 2020-09-05 DIAGNOSIS — R921 Mammographic calcification found on diagnostic imaging of breast: Secondary | ICD-10-CM

## 2020-09-23 ENCOUNTER — Ambulatory Visit (INDEPENDENT_AMBULATORY_CARE_PROVIDER_SITE_OTHER): Payer: Self-pay | Admitting: Plastic Surgery

## 2020-09-23 ENCOUNTER — Encounter: Payer: Self-pay | Admitting: Plastic Surgery

## 2020-09-23 ENCOUNTER — Other Ambulatory Visit: Payer: Self-pay

## 2020-09-23 DIAGNOSIS — Z719 Counseling, unspecified: Secondary | ICD-10-CM

## 2020-09-23 NOTE — Progress Notes (Signed)

## 2020-10-05 ENCOUNTER — Other Ambulatory Visit: Payer: Self-pay | Admitting: General Surgery

## 2020-10-05 DIAGNOSIS — N6489 Other specified disorders of breast: Secondary | ICD-10-CM

## 2020-10-13 ENCOUNTER — Other Ambulatory Visit: Payer: Self-pay | Admitting: General Surgery

## 2020-10-13 DIAGNOSIS — N6489 Other specified disorders of breast: Secondary | ICD-10-CM

## 2020-11-16 ENCOUNTER — Ambulatory Visit
Admission: RE | Admit: 2020-11-16 | Discharge: 2020-11-16 | Disposition: A | Discharge status: Home / Self Care | Payer: Medicare PPO | Source: Ambulatory Visit | Attending: General Surgery | Admitting: General Surgery

## 2020-11-16 ENCOUNTER — Ambulatory Visit: Payer: Medicare PPO

## 2020-11-16 DIAGNOSIS — N6489 Other specified disorders of breast: Secondary | ICD-10-CM

## 2020-12-02 ENCOUNTER — Encounter: Payer: Self-pay | Admitting: Plastic Surgery

## 2020-12-02 ENCOUNTER — Ambulatory Visit (INDEPENDENT_AMBULATORY_CARE_PROVIDER_SITE_OTHER): Payer: Self-pay | Admitting: Plastic Surgery

## 2020-12-02 ENCOUNTER — Other Ambulatory Visit: Payer: Self-pay

## 2020-12-02 DIAGNOSIS — Z719 Counseling, unspecified: Secondary | ICD-10-CM

## 2020-12-02 NOTE — Progress Notes (Signed)
Botulinum Toxin Procedure Note  Procedure: Cosmetic botulinum toxin   Pre-operative Diagnosis: Dynamic rhytides   Post-operative Diagnosis: Same  Complications:  None  Brief history: The patient desires botulinum toxin injection of her forehead. I discussed with the patient this proposed procedure of botulinum toxin injections, which is customized depending on the particular needs of the patient. It is performed on facial rhytids as a temporary correction. The alternatives were discussed with the patient. The risks were addressed including bleeding, scarring, infection, damage to deeper structures, asymmetry, and chronic pain, which may occur infrequently after a procedure. The individual's choice to undergo a surgical procedure is based on the comparison of risks to potential benefits. Other risks include unsatisfactory results, brow ptosis, eyelid ptosis, allergic reaction, temporary paralysis, which should go away with time, bruising, blurring disturbances and delayed healing. Botulinum toxin injections do not arrest the aging process or produce permanent tightening of the eyelid.  Operative intervention maybe necessary to maintain the results of a blepharoplasty or botulinum toxin. The patient understands and wishes to proceed.  Procedure: The area was prepped with alcohol and dried with a clean gauze. Using a clean technique, the botulinum toxin was diluted with 1.25 cc of preservative-free normal saline which was slowly injected with an 18 gauge needle in a tuberculin syringes.  A 32 gauge needles were then used to inject the botulinum toxin. This mixture allow for an aliquot of 4 units per 0.1 cc in each injection site.    Subsequently the mixture was injected in the glabellar and forehead area with preservation of the temporal branch to the lateral eyebrow as well as into each lateral canthal area beginning from the lateral orbital rim medial to the zygomaticus major in 3 separate areas. A  total of 32 Units of botulinum toxin was used. The forehead and glabellar area was injected with care to inject intramuscular only while holding pressure on the supratrochlear vessels in each area during each injection on either side of the medial corrugators. The injection proceeded vertically superiorly to the medial 2/3 of the frontalis muscle and superior 2/3 of the lateral frontalis, again with preservation of the frontal branch.  No complications were noted. Light pressure was held for 5 minutes. She was instructed explicitly in post-operative care.  Botox LOT:  [id number] EXP:  7/24 

## 2021-02-24 ENCOUNTER — Encounter: Payer: Self-pay | Admitting: Plastic Surgery

## 2021-02-24 ENCOUNTER — Other Ambulatory Visit: Payer: Self-pay

## 2021-02-24 ENCOUNTER — Ambulatory Visit (INDEPENDENT_AMBULATORY_CARE_PROVIDER_SITE_OTHER): Payer: Self-pay | Admitting: Plastic Surgery

## 2021-02-24 DIAGNOSIS — Z719 Counseling, unspecified: Secondary | ICD-10-CM

## 2021-02-24 NOTE — Progress Notes (Signed)

## 2021-03-09 ENCOUNTER — Ambulatory Visit
Admission: RE | Admit: 2021-03-09 | Discharge: 2021-03-09 | Disposition: A | Discharge status: Home / Self Care | Payer: Medicare PPO | Source: Ambulatory Visit | Attending: General Surgery | Admitting: General Surgery

## 2021-03-09 DIAGNOSIS — N6489 Other specified disorders of breast: Secondary | ICD-10-CM

## 2021-03-17 ENCOUNTER — Other Ambulatory Visit: Payer: Medicare PPO | Admitting: Plastic Surgery

## 2021-03-24 ENCOUNTER — Other Ambulatory Visit: Payer: Self-pay

## 2021-03-24 ENCOUNTER — Ambulatory Visit (INDEPENDENT_AMBULATORY_CARE_PROVIDER_SITE_OTHER): Payer: Self-pay | Admitting: Surgical

## 2021-03-24 DIAGNOSIS — R238 Other skin changes: Secondary | ICD-10-CM

## 2021-03-24 DIAGNOSIS — Z719 Counseling, unspecified: Secondary | ICD-10-CM

## 2021-03-24 NOTE — Progress Notes (Signed)
Preoperative Dx: Facial aging, hyperpigmentation/sun damage  Postoperative Dx:  same  Procedure: laser to face  Anesthesia: none  Description of Procedure:  Risks and complications were explained to the patient. Consent was confirmed and signed. Time out was called and all information was confirmed to be correct. The area  area was prepped with alcohol and wiped dry.   The 560 nm BBL laser was set at 8 J/cm2. The face was lasered with 2 passes.  The 515 nm BBL laser was set at 7 J/cm and the face was lasered with 2 passes.  The patient tolerated the procedure well and there were no complications. The patient is to follow up in 4 weeks.

## 2021-03-29 ENCOUNTER — Ambulatory Visit: Payer: Self-pay | Admitting: General Surgery

## 2021-03-29 DIAGNOSIS — N6489 Other specified disorders of breast: Secondary | ICD-10-CM

## 2021-03-31 ENCOUNTER — Other Ambulatory Visit: Payer: Self-pay | Admitting: General Surgery

## 2021-03-31 DIAGNOSIS — N6489 Other specified disorders of breast: Secondary | ICD-10-CM

## 2021-04-28 ENCOUNTER — Other Ambulatory Visit: Payer: Self-pay

## 2021-04-28 ENCOUNTER — Ambulatory Visit (INDEPENDENT_AMBULATORY_CARE_PROVIDER_SITE_OTHER): Payer: Self-pay | Admitting: Surgical

## 2021-04-28 DIAGNOSIS — Z719 Counseling, unspecified: Secondary | ICD-10-CM

## 2021-04-28 NOTE — Progress Notes (Signed)
Preoperative Dx: Facial aging, hyperpigmentation and sun damage ? ?Postoperative Dx:  same ? ?Procedure: laser to face  ? ?Anesthesia: none ? ?Description of Procedure:  ?Risks and complications were explained to the patient. Consent was confirmed and signed. Time out was called and all information was confirmed to be correct. The area  area was prepped with alcohol and wiped dry. The 560 nm laser was set at 9 J/cm2. The face was lasered. The 515 nm laser was set at 7 j/cm2, the face was lasered. The patient tolerated the procedure well and there were no complications.  ? ? ? ? ?

## 2021-05-01 ENCOUNTER — Encounter (HOSPITAL_BASED_OUTPATIENT_CLINIC_OR_DEPARTMENT_OTHER): Payer: Self-pay | Admitting: General Surgery

## 2021-05-01 ENCOUNTER — Other Ambulatory Visit: Payer: Self-pay

## 2021-05-03 NOTE — Progress Notes (Signed)
Patient was provided with CHG cleanser to use at home before the procedure. Patient verbalized understanding of instructions.       Patient Instructions  The night before surgery:  No food after midnight. ONLY clear liquids after midnight  The day of surgery (if you do NOT have diabetes):  Drink ONE (1) Pre-Surgery Clear Ensure as directed.   This drink was given to you during your hospital  pre-op appointment visit. The pre-op nurse will instruct you on the time to drink the  Pre-Surgery Ensure depending on your surgery time. Finish the drink at the designated time by the pre-op nurse.  Nothing else to drink after completing the  Pre-Surgery Clear Ensure.  The day of surgery (if you have diabetes): Drink ONE (1) Gatorade 2 (G2) as directed. This drink was given to you during your hospital  pre-op appointment visit.  The pre-op nurse will instruct you on the time to drink the   Gatorade 2 (G2) depending on your surgery time. Color of the Gatorade may vary. Red is not allowed. Nothing else to drink after completing the  Gatorade 2 (G2).         If you have questions, please contact your surgeon's office.  

## 2021-05-06 HISTORY — PX: BREAST EXCISIONAL BIOPSY: SUR124

## 2021-05-09 ENCOUNTER — Ambulatory Visit
Admission: RE | Admit: 2021-05-09 | Discharge: 2021-05-09 | Disposition: A | Discharge status: Home / Self Care | Payer: Medicare PPO | Source: Ambulatory Visit | Attending: General Surgery | Admitting: General Surgery

## 2021-05-09 DIAGNOSIS — N6489 Other specified disorders of breast: Secondary | ICD-10-CM

## 2021-05-10 ENCOUNTER — Other Ambulatory Visit: Payer: Self-pay

## 2021-05-10 ENCOUNTER — Encounter (HOSPITAL_BASED_OUTPATIENT_CLINIC_OR_DEPARTMENT_OTHER): Payer: Self-pay | Admitting: General Surgery

## 2021-05-10 ENCOUNTER — Ambulatory Visit
Admission: RE | Admit: 2021-05-10 | Discharge: 2021-05-10 | Disposition: A | Discharge status: Home / Self Care | Payer: Medicare PPO | Source: Ambulatory Visit | Attending: General Surgery | Admitting: General Surgery

## 2021-05-10 ENCOUNTER — Encounter (HOSPITAL_BASED_OUTPATIENT_CLINIC_OR_DEPARTMENT_OTHER)
Admission: RE | Disposition: A | Discharge status: Home / Self Care | Payer: Self-pay | Source: Home / Self Care | Attending: General Surgery

## 2021-05-10 ENCOUNTER — Ambulatory Visit (HOSPITAL_BASED_OUTPATIENT_CLINIC_OR_DEPARTMENT_OTHER): Payer: Medicare PPO | Admitting: Certified Registered"

## 2021-05-10 ENCOUNTER — Ambulatory Visit (HOSPITAL_BASED_OUTPATIENT_CLINIC_OR_DEPARTMENT_OTHER)
Admission: RE | Admit: 2021-05-10 | Discharge: 2021-05-10 | Disposition: A | Discharge status: Home / Self Care | Payer: Medicare PPO | Attending: General Surgery | Admitting: General Surgery

## 2021-05-10 DIAGNOSIS — N6489 Other specified disorders of breast: Secondary | ICD-10-CM

## 2021-05-10 DIAGNOSIS — N649 Disorder of breast, unspecified: Secondary | ICD-10-CM | POA: Insufficient documentation

## 2021-05-10 DIAGNOSIS — N6081 Other benign mammary dysplasias of right breast: Secondary | ICD-10-CM | POA: Diagnosis not present

## 2021-05-10 HISTORY — PX: BREAST LUMPECTOMY WITH RADIOACTIVE SEED LOCALIZATION: SHX6424

## 2021-05-10 HISTORY — DX: Personal history of urinary calculi: Z87.442

## 2021-05-10 SURGERY — BREAST LUMPECTOMY WITH RADIOACTIVE SEED LOCALIZATION
Anesthesia: General | Site: Breast | Laterality: Right

## 2021-05-10 MED ORDER — FENTANYL CITRATE (PF) 100 MCG/2ML IJ SOLN
INTRAMUSCULAR | Status: DC | PRN
  Administered 2021-05-10 (×2): 50 ug via INTRAVENOUS

## 2021-05-10 MED ORDER — CELECOXIB 200 MG PO CAPS
200.0000 mg | ORAL_CAPSULE | ORAL | Status: AC
  Administered 2021-05-10: 200 mg via ORAL

## 2021-05-10 MED ORDER — FENTANYL CITRATE (PF) 100 MCG/2ML IJ SOLN: 25.0000 ug | INTRAMUSCULAR | Status: DC | PRN

## 2021-05-10 MED ORDER — LIDOCAINE HCL (CARDIAC) PF 100 MG/5ML IV SOSY
PREFILLED_SYRINGE | INTRAVENOUS | Status: DC | PRN
  Administered 2021-05-10: 60 mg via INTRAVENOUS

## 2021-05-10 MED ORDER — MIDAZOLAM HCL 2 MG/2ML IJ SOLN
INTRAMUSCULAR | Status: AC
Start: 2021-05-10 — End: ?
  Filled 2021-05-10: qty 2

## 2021-05-10 MED ORDER — DEXAMETHASONE SODIUM PHOSPHATE 4 MG/ML IJ SOLN
INTRAMUSCULAR | Status: DC | PRN
  Administered 2021-05-10: 5 mg via INTRAVENOUS

## 2021-05-10 MED ORDER — OXYCODONE HCL 5 MG/5ML PO SOLN: 5.0000 mg | Freq: Once | ORAL | Status: DC | PRN

## 2021-05-10 MED ORDER — GABAPENTIN 300 MG PO CAPS
300.0000 mg | ORAL_CAPSULE | ORAL | Status: AC
  Administered 2021-05-10: 300 mg via ORAL

## 2021-05-10 MED ORDER — PROPOFOL 500 MG/50ML IV EMUL
INTRAVENOUS | Status: AC
  Filled 2021-05-10: qty 100

## 2021-05-10 MED ORDER — CHLORHEXIDINE GLUCONATE CLOTH 2 % EX PADS: 6.0000 | MEDICATED_PAD | Freq: Once | CUTANEOUS | Status: DC

## 2021-05-10 MED ORDER — BUPIVACAINE-EPINEPHRINE (PF) 0.25% -1:200000 IJ SOLN
INTRAMUSCULAR | Status: AC
  Filled 2021-05-10: qty 90

## 2021-05-10 MED ORDER — ONDANSETRON HCL 4 MG/2ML IJ SOLN: 4.0000 mg | Freq: Once | INTRAMUSCULAR | Status: DC | PRN

## 2021-05-10 MED ORDER — 0.9 % SODIUM CHLORIDE (POUR BTL) OPTIME
TOPICAL | Status: DC | PRN
  Administered 2021-05-10: 120 mL

## 2021-05-10 MED ORDER — LIDOCAINE 2% (20 MG/ML) 5 ML SYRINGE
INTRAMUSCULAR | Status: AC
  Filled 2021-05-10: qty 5

## 2021-05-10 MED ORDER — ACETAMINOPHEN 500 MG PO TABS
1000.0000 mg | ORAL_TABLET | ORAL | Status: AC
  Administered 2021-05-10: 1000 mg via ORAL

## 2021-05-10 MED ORDER — DEXAMETHASONE SODIUM PHOSPHATE 10 MG/ML IJ SOLN
INTRAMUSCULAR | Status: AC
  Filled 2021-05-10: qty 1

## 2021-05-10 MED ORDER — ONDANSETRON HCL 4 MG/2ML IJ SOLN
INTRAMUSCULAR | Status: DC | PRN
  Administered 2021-05-10: 4 mg via INTRAVENOUS

## 2021-05-10 MED ORDER — ONDANSETRON HCL 4 MG/2ML IJ SOLN
INTRAMUSCULAR | Status: AC
Start: 2021-05-10 — End: ?
  Filled 2021-05-10: qty 2

## 2021-05-10 MED ORDER — CELECOXIB 200 MG PO CAPS
ORAL_CAPSULE | ORAL | Status: AC
  Filled 2021-05-10: qty 1

## 2021-05-10 MED ORDER — OXYCODONE HCL 5 MG PO TABS: 5.0000 mg | ORAL_TABLET | Freq: Four times a day (QID) | ORAL | 0 refills | Status: DC | PRN

## 2021-05-10 MED ORDER — ACETAMINOPHEN 500 MG PO TABS
ORAL_TABLET | ORAL | Status: AC
  Filled 2021-05-10: qty 2

## 2021-05-10 MED ORDER — BUPIVACAINE-EPINEPHRINE (PF) 0.25% -1:200000 IJ SOLN
INTRAMUSCULAR | Status: DC | PRN
  Administered 2021-05-10: 20 mL via PERINEURAL

## 2021-05-10 MED ORDER — FENTANYL CITRATE (PF) 100 MCG/2ML IJ SOLN
INTRAMUSCULAR | Status: AC
  Filled 2021-05-10: qty 2

## 2021-05-10 MED ORDER — LACTATED RINGERS IV SOLN
INTRAVENOUS | Status: DC
Start: 2021-05-10 — End: 2021-05-10

## 2021-05-10 MED ORDER — EPHEDRINE 5 MG/ML INJ
INTRAVENOUS | Status: AC
Start: 2021-05-10 — End: ?
  Filled 2021-05-10: qty 10

## 2021-05-10 MED ORDER — PROPOFOL 10 MG/ML IV BOLUS
INTRAVENOUS | Status: DC | PRN
  Administered 2021-05-10: 120 mg via INTRAVENOUS

## 2021-05-10 MED ORDER — GABAPENTIN 300 MG PO CAPS
ORAL_CAPSULE | ORAL | Status: AC
  Filled 2021-05-10: qty 1

## 2021-05-10 MED ORDER — OXYCODONE HCL 5 MG PO TABS: 5.0000 mg | ORAL_TABLET | Freq: Once | ORAL | Status: DC | PRN

## 2021-05-10 MED ORDER — PROPOFOL 500 MG/50ML IV EMUL
INTRAVENOUS | Status: DC | PRN
  Administered 2021-05-10: 150 ug/kg/min via INTRAVENOUS

## 2021-05-10 MED ORDER — CEFAZOLIN SODIUM-DEXTROSE 2-4 GM/100ML-% IV SOLN
2.0000 g | INTRAVENOUS | Status: AC
  Administered 2021-05-10: 2 g via INTRAVENOUS

## 2021-05-10 MED ORDER — CEFAZOLIN SODIUM-DEXTROSE 2-4 GM/100ML-% IV SOLN
INTRAVENOUS | Status: AC
  Filled 2021-05-10: qty 100

## 2021-05-10 MED ORDER — AMISULPRIDE (ANTIEMETIC) 5 MG/2ML IV SOLN: 10.0000 mg | Freq: Once | INTRAVENOUS | Status: DC | PRN

## 2021-05-10 SURGICAL SUPPLY — 35 items
ADH SKN CLS APL DERMABOND .7 (GAUZE/BANDAGES/DRESSINGS) ×1
APL PRP STRL LF DISP 70% ISPRP (MISCELLANEOUS) ×1
BLADE SURG 15 STRL LF DISP TIS (BLADE) ×1 IMPLANT
BLADE SURG 15 STRL SS (BLADE) ×2
CANISTER SUCT 1200ML W/VALVE (MISCELLANEOUS) ×2 IMPLANT
CHLORAPREP W/TINT 26 (MISCELLANEOUS) ×2 IMPLANT
COVER BACK TABLE 60X90IN (DRAPES) ×2 IMPLANT
COVER MAYO STAND STRL (DRAPES) ×2 IMPLANT
COVER PROBE W GEL 5X96 (DRAPES) ×2 IMPLANT
DERMABOND ADVANCED (GAUZE/BANDAGES/DRESSINGS) ×1
DERMABOND ADVANCED .7 DNX12 (GAUZE/BANDAGES/DRESSINGS) ×1 IMPLANT
DRAPE LAPAROSCOPIC ABDOMINAL (DRAPES) ×2 IMPLANT
DRAPE UTILITY XL STRL (DRAPES) ×2 IMPLANT
ELECT COATED BLADE 2.86 ST (ELECTRODE) ×2 IMPLANT
ELECT REM PT RETURN 9FT ADLT (ELECTROSURGICAL) ×2
ELECTRODE REM PT RTRN 9FT ADLT (ELECTROSURGICAL) ×1 IMPLANT
GLOVE SURG ENC MOIS LTX SZ7.5 (GLOVE) ×3 IMPLANT
GLOVE SURG UNDER POLY LF SZ7 (GLOVE) ×1 IMPLANT
GOWN STRL REUS W/ TWL LRG LVL3 (GOWN DISPOSABLE) ×2 IMPLANT
GOWN STRL REUS W/TWL LRG LVL3 (GOWN DISPOSABLE) ×4
KIT MARKER MARGIN INK (KITS) ×2 IMPLANT
NDL HYPO 25X1 1.5 SAFETY (NEEDLE) IMPLANT
NEEDLE HYPO 25X1 1.5 SAFETY (NEEDLE) ×2 IMPLANT
NS IRRIG 1000ML POUR BTL (IV SOLUTION) ×1 IMPLANT
PACK BASIN DAY SURGERY FS (CUSTOM PROCEDURE TRAY) ×2 IMPLANT
PENCIL SMOKE EVACUATOR (MISCELLANEOUS) ×2 IMPLANT
SLEEVE SCD COMPRESS KNEE MED (STOCKING) ×2 IMPLANT
SPONGE T-LAP 18X18 ~~LOC~~+RFID (SPONGE) ×2 IMPLANT
SUT MON AB 4-0 PC3 18 (SUTURE) ×2 IMPLANT
SUT VICRYL 3-0 CR8 SH (SUTURE) ×2 IMPLANT
SYR CONTROL 10ML LL (SYRINGE) ×1 IMPLANT
TOWEL GREEN STERILE FF (TOWEL DISPOSABLE) ×2 IMPLANT
TRAY FAXITRON CT DISP (TRAY / TRAY PROCEDURE) ×2 IMPLANT
TUBE CONNECTING 20X1/4 (TUBING) ×2 IMPLANT
YANKAUER SUCT BULB TIP NO VENT (SUCTIONS) ×1 IMPLANT

## 2021-05-10 NOTE — Interval H&P Note (Signed)
History and Physical Interval Note: ? ?05/10/2021 ?8:01 AM ? ?Brandi Hines  has presented today for surgery, with the diagnosis of RIGHT BREAST CSL.  The various methods of treatment have been discussed with the patient and family. After consideration of risks, benefits and other options for treatment, the patient has consented to  Procedure(s): ?RIGHT BREAST LUMPECTOMY WITH RADIOACTIVE SEED LOCALIZATION (Right) as a surgical intervention.  The patient's history has been reviewed, patient examined, no change in status, stable for surgery.  I have reviewed the patient's chart and labs.  Questions were answered to the patient's satisfaction.   ? ? ?Chevis Pretty III ? ? ?

## 2021-05-10 NOTE — H&P (Signed)
?PROVIDER: Landry Corporal, MD ? ?MRN: [id number] ?DOB: 02-22-1950 ?Subjective  ? ?Chief Complaint: Long Term Follow Up (Right Breast ) ? ? ?History of Present Illness: ?Brandi Hines is a 71 y.o. female who is seen today for a complex sclerosing lesion. The patient is a 71 year old white female who was seen about 6 months ago with a 5 mm area of complex sclerosing lesion in the lower inner quadrant of the right breast. At that time she elected for observation. On her recent mammogram and ultrasound there is only a residual punctate calcification noted to the left at the site. She has been thinking about it and at this point would like to have surgery to remove the rest of the calcifications in this area. ? ? ? ?Review of Systems: ?A complete review of systems was obtained from the patient. I have reviewed this information and discussed as appropriate with the patient. See HPI as well for other ROS. ? ?ROS  ? ?Medical History: ?Past Medical History:  ?Diagnosis Date  ? Depression  ? ?Patient Active Problem List  ?Diagnosis  ? Complex sclerosing lesion of right breast  ? ?Past Surgical History:  ?Procedure Laterality Date  ? HYSTERECTOMY 08/1988  ? APPENDECTOMY 10/1989  ? JOINT REPLACEMENT Bilateral  ?Hip - 12/2016, 06/2017  ? ? ?No Known Allergies ? ?Current Outpatient Medications on File Prior to Visit  ?Medication Sig Dispense Refill  ? butalbital-acetaminophen-caffeine (FIORICET) 50-325-40 mg capsule TAKE ONE OR TWO TABLETS AT THE ONSET OF HEADACHE  ? clonazePAM (KLONOPIN) 0.5 MG tablet TAKE 1/2 TO ONE TABLET TWICE A DAY AS NEEDED FOR ANXIETY  ? estradioL (ESTRACE) 0.5 MG tablet Take 0.5 mg by mouth once daily  ? FLUoxetine (PROZAC) 40 MG capsule fluoxetine 40 mg capsule ?TAKE ONE CAPSULE DAILY FOR CONTROL OF ANXIETY  ? zolpidem (AMBIEN) 10 mg tablet TAKE 1/2 TO ONE TABLET AT BEDTIME FOR INSOMNIA  ? ?No current facility-administered medications on file prior to visit.  ? ?History reviewed. No pertinent family  history.  ? ?Social History  ? ?Tobacco Use  ?Smoking Status Never  ?Smokeless Tobacco Never  ? ? ?Social History  ? ?Socioeconomic History  ? Marital status: Married  ?Tobacco Use  ? Smoking status: Never  ? Smokeless tobacco: Never  ?Substance and Sexual Activity  ? Alcohol use: Never  ? Drug use: Never  ? ?Objective:  ? ?Vitals:  ?Weight: 59.2 kg (130 lb 9.6 oz)  ?Height: 167.6 cm (5\' 6" )  ? ?Body mass index is 21.08 kg/m?. ? ?Physical Exam ?Vitals reviewed.  ?Constitutional:  ?General: She is not in acute distress. ?Appearance: Normal appearance.  ?HENT:  ?Head: Normocephalic and atraumatic.  ?Right Ear: External ear normal.  ?Left Ear: External ear normal.  ?Nose: Nose normal.  ?Mouth/Throat:  ?Mouth: Mucous membranes are moist.  ?Pharynx: Oropharynx is clear.  ?Eyes:  ?General: No scleral icterus. ?Extraocular Movements: Extraocular movements intact.  ?Conjunctiva/sclera: Conjunctivae normal.  ?Pupils: Pupils are equal, round, and reactive to light.  ?Cardiovascular:  ?Rate and Rhythm: Normal rate and regular rhythm.  ?Pulses: Normal pulses.  ?Heart sounds: Normal heart sounds.  ?Pulmonary:  ?Effort: Pulmonary effort is normal. No respiratory distress.  ?Breath sounds: Normal breath sounds.  ?Abdominal:  ?General: Bowel sounds are normal.  ?Palpations: Abdomen is soft.  ?Tenderness: There is no abdominal tenderness.  ?Musculoskeletal:  ?General: No swelling, tenderness or deformity. Normal range of motion.  ?Cervical back: Normal range of motion and neck supple.  ?Skin: ?General: Skin is warm  and dry.  ?Coloration: Skin is not jaundiced.  ?Neurological:  ?General: No focal deficit present.  ?Mental Status: She is alert and oriented to person, place, and time.  ?Psychiatric:  ?Mood and Affect: Mood normal.  ?Behavior: Behavior normal.  ? ? ? ?Breast: There is no palpable mass in either breast. There is no palpable axillary, supraclavicular, or cervical lymphadenopathy. ? ?Labs, Imaging and Diagnostic  Testing: ? ?Assessment and Plan:  ? ?Diagnoses and all orders for this visit: ? ?Complex sclerosing lesion of right breast ? ? ? ?The patient appears to have a small area of complex sclerosing lesion in the lower inner quadrant of the right breast. The patient initially elected for observation but now would like to have the area removed. I have discussed with her in detail the risks and benefits of the operation as well as some of the technical aspects including the use of a radioactive seed for localization and she understands and wishes to proceed.  ?

## 2021-05-10 NOTE — Op Note (Signed)
05/10/2021 ? ?9:06 AM ? ?PATIENT:  Brandi Hines  71 y.o. female ? ?PRE-OPERATIVE DIAGNOSIS:  RIGHT BREAST COMPLEX SCLEROSING LESION ? ?POST-OPERATIVE DIAGNOSIS:  RIGHT BREAST COMPLEX SCLEROSING LESION ? ?PROCEDURE:  Procedure(s): ?RIGHT BREAST LUMPECTOMY WITH RADIOACTIVE SEED LOCALIZATION (Right) ? ?SURGEON:  Surgeon(s) and Role: ?   Griselda Miner, MD - Primary ? ?PHYSICIAN ASSISTANT:  ? ?ASSISTANTS: none  ? ?ANESTHESIA:   local and general ? ?EBL:  10 mL  ? ?BLOOD ADMINISTERED:none ? ?DRAINS: none  ? ?LOCAL MEDICATIONS USED:  MARCAINE    ? ?SPECIMEN:  Source of Specimen:  right breast tissue ? ?DISPOSITION OF SPECIMEN:  PATHOLOGY ? ?COUNTS:  YES ? ?TOURNIQUET:  * No tourniquets in log * ? ?DICTATION: .Dragon Dictation ? ?After informed consent was obtained the patient was brought to the operating room and placed in the supine position on the operating table.  After adequate induction of general anesthesia the patient's right breast was prepped with ChloraPrep, allowed to dry, and draped in usual sterile manner.  An appropriate timeout was performed.  Previously an I-125 seed was placed in the lower inner central right breast to mark an area of complex sclerosing lesion.  The neoprobe was set to I-125 in the area of radioactivity was readily identified.  The area around this was infiltrated with quarter percent Marcaine.  A curvilinear incision was made along the lower inner edge of the areola of the right breast with a 15 blade knife.  The incision was carried through the skin and subcutaneous tissue sharply with the electrocautery.  Dissection was then carried towards the radioactive seed under the direction of the neoprobe.  Once I more closely approached the radioactive seed I then removed a circular portion of breast tissue sharply with the electrocautery around the radioactive seed while checking the area of radioactivity frequently.  Of note the specimen was removed in 2 parts.  The deep margin of the  lumpectomy specimen was separate from the rest of the lumpectomy.  The dissection was carried all the way to the muscle of the chest wall.  The lumpectomy tissue was then oriented with the appropriate paint colors.  A specimen radiograph was obtained that showed the clip and seed to be within the deeper second portion of the lumpectomy specimen.  The specimen tissue was then sent to pathology for further evaluation.  Hemostasis was achieved using the Bovie electrocautery.  The wound was irrigated with saline and infiltrated with more quarter percent Marcaine.  The deep layer of the wound was then closed with layers of interrupted 3-0 Vicryl stitches.  The skin was then closed with interrupted 4-0 Monocryl subcuticular stitches.  Dermabond dressings were applied.  The patient tolerated the procedure well.  At the end of the case all needle sponge and instrument counts were correct.  The patient was then awakened and taken to recovery in stable condition. ? ?PLAN OF CARE: Discharge to home after PACU ? ?PATIENT DISPOSITION:  PACU - hemodynamically stable. ?  ?Delay start of Pharmacological VTE agent (>24hrs) due to surgical blood loss or risk of bleeding: not applicable ? ?

## 2021-05-10 NOTE — Anesthesia Procedure Notes (Signed)
Procedure Name: LMA Insertion ?Date/Time: 05/10/2021 8:48 AM ?Performed by: Lance Coon, CRNA ?Pre-anesthesia Checklist: Patient identified, Emergency Drugs available, Suction available and Patient being monitored ?Patient Re-evaluated:Patient Re-evaluated prior to induction ?Oxygen Delivery Method: Circle system utilized ?Preoxygenation: Pre-oxygenation with 100% oxygen ?Induction Type: IV induction ?Ventilation: Mask ventilation without difficulty ?LMA: LMA inserted ?LMA Size: 4.0 ?Number of attempts: 1 ?Airway Equipment and Method: Bite block ?Placement Confirmation: positive ETCO2 ?Tube secured with: Tape ?Dental Injury: Teeth and Oropharynx as per pre-operative assessment  ? ? ? ? ?

## 2021-05-10 NOTE — Anesthesia Preprocedure Evaluation (Signed)
Anesthesia Evaluation  ?Patient identified by MRN, date of birth, ID band ?Patient awake ? ? ? ?Reviewed: ?Allergy & Precautions, NPO status , Patient's Chart, lab work & pertinent test results ? ?History of Anesthesia Complications ?(+) PONV and history of anesthetic complications ? ?Airway ?Mallampati: II ? ?TM Distance: >3 FB ?Neck ROM: Full ? ? ? Dental ?no notable dental hx. ?(+) Teeth Intact, Dental Advisory Given ?  ?Pulmonary ?neg pulmonary ROS,  ?  ?Pulmonary exam normal ?breath sounds clear to auscultation ? ? ? ? ? ? Cardiovascular ?negative cardio ROS ?Normal cardiovascular exam ?Rhythm:Regular Rate:Normal ? ? ?  ?Neuro/Psych ?negative neurological ROS ? negative psych ROS  ? GI/Hepatic ?Neg liver ROS, Hx/o colitis ?  ?Endo/Other  ?Right breast mass ? Renal/GU ?Hx/o renal calculi  ? ?Hx/o interstitial cystitis ? ?  ?Musculoskeletal ? ?(+) Arthritis , Osteoarthritis,   ? Abdominal ?  ?Peds ? Hematology ?negative hematology ROS ?(+)   ?Anesthesia Other Findings ? ? Reproductive/Obstetrics ? ?  ? ? ? ? ? ? ? ? ? ? ? ? ? ?  ?  ? ? ? ? ? ? ? ? ?Anesthesia Physical ?Anesthesia Plan ? ?ASA: 2 ? ?Anesthesia Plan: General  ? ?Post-op Pain Management:   ? ?Induction: Intravenous ? ?PONV Risk Score and Plan: 4 or greater and Treatment may vary due to age or medical condition, TIVA, Propofol infusion, Ondansetron and Dexamethasone ? ?Airway Management Planned: LMA ? ?Additional Equipment: None ? ?Intra-op Plan:  ? ?Post-operative Plan: Extubation in OR ? ?Informed Consent: I have reviewed the patients History and Physical, chart, labs and discussed the procedure including the risks, benefits and alternatives for the proposed anesthesia with the patient or authorized representative who has indicated his/her understanding and acceptance.  ? ? ? ?Dental advisory given ? ?Plan Discussed with: CRNA and Anesthesiologist ? ?Anesthesia Plan Comments:   ? ? ? ? ? ? ?Anesthesia Quick  Evaluation ? ?

## 2021-05-10 NOTE — Discharge Instructions (Signed)
?  Post Anesthesia Home Care Instructions ? ?Activity: ?Get plenty of rest for the remainder of the day. A responsible individual must stay with you for 24 hours following the procedure.  ?For the next 24 hours, DO NOT: ?-Drive a car ?-Paediatric nurse ?-Drink alcoholic beverages ?-Take any medication unless instructed by your physician ?-Make any legal decisions or sign important papers. ? ?Meals: ?Start with liquid foods such as gelatin or soup. Progress to regular foods as tolerated. Avoid greasy, spicy, heavy foods. If nausea and/or vomiting occur, drink only clear liquids until the nausea and/or vomiting subsides. Call your physician if vomiting continues. ? ?Special Instructions/Symptoms: ?Your throat may feel dry or sore from the anesthesia or the breathing tube placed in your throat during surgery. If this causes discomfort, gargle with warm salt water. The discomfort should disappear within 24 hours. ? ?If you had a scopolamine patch placed behind your ear for the management of post- operative nausea and/or vomiting: ? ?1. The medication in the patch is effective for 72 hours, after which it should be removed.  Wrap patch in a tissue and discard in the trash. Wash hands thoroughly with soap and water. ?2. You may remove the patch earlier than 72 hours if you experience unpleasant side effects which may include dry mouth, dizziness or visual disturbances. ?3. Avoid touching the patch. Wash your hands with soap and water after contact with the patch. ? ? ?No tylenol or ibuprofen until after 1pm if needed today. ?    ?

## 2021-05-10 NOTE — Transfer of Care (Signed)
Immediate Anesthesia Transfer of Care Note ? ?Patient: Brandi Hines ? ?Procedure(s) Performed: RIGHT BREAST LUMPECTOMY WITH RADIOACTIVE SEED LOCALIZATION (Right: Breast) ? ?Patient Location: PACU ? ?Anesthesia Type:General ? ?Level of Consciousness: sedated ? ?Airway & Oxygen Therapy: Patient Spontanous Breathing and Patient connected to face mask oxygen ? ?Post-op Assessment: Report given to RN and Post -op Vital signs reviewed and stable ? ?Post vital signs: Reviewed and stable ? ?Last Vitals:  ?Vitals Value Taken Time  ?BP 82/51 05/10/21 0911  ?Temp    ?Pulse 75 05/10/21 0912  ?Resp 13 05/10/21 0912  ?SpO2 99 % 05/10/21 0912  ?Vitals shown include unvalidated device data. ? ?Last Pain:  ?Vitals:  ? 05/10/21 0708  ?TempSrc: Oral  ?PainSc: 0-No pain  ?   ? ?Patients Stated Pain Goal: 4 (05/10/21 0708) ? ?Complications: No notable events documented. ?

## 2021-05-10 NOTE — Anesthesia Postprocedure Evaluation (Signed)
Anesthesia Post Note ? ?Patient: Brandi ParkinsonVicki Vaughn Hines ? ?Procedure(s) Performed: RIGHT BREAST LUMPECTOMY WITH RADIOACTIVE SEED LOCALIZATION (Right: Breast) ? ?  ? ?Patient location during evaluation: PACU ?Anesthesia Type: General ?Level of consciousness: awake and alert and oriented ?Pain management: pain level controlled ?Vital Signs Assessment: post-procedure vital signs reviewed and stable ?Respiratory status: spontaneous breathing, nonlabored ventilation and respiratory function stable ?Cardiovascular status: blood pressure returned to baseline and stable ?Postop Assessment: no apparent nausea or vomiting ?Anesthetic complications: no ? ? ?No notable events documented. ? ?Last Vitals:  ?Vitals:  ? 05/10/21 0915 05/10/21 0930  ?BP: (!) 93/59   ?Pulse: (!) 118 72  ?Resp: 15 (!) 6  ?Temp:    ?SpO2: (!) 81% 98%  ?  ?Last Pain:  ?Vitals:  ? 05/10/21 0930  ?TempSrc:   ?PainSc: 0-No pain  ? ? ?  ?  ?  ?  ?  ?  ? ?Izaiha Lo A. ? ? ? ? ?

## 2021-05-11 ENCOUNTER — Encounter (HOSPITAL_BASED_OUTPATIENT_CLINIC_OR_DEPARTMENT_OTHER): Payer: Self-pay | Admitting: General Surgery

## 2021-05-12 LAB — SURGICAL PATHOLOGY

## 2021-05-19 ENCOUNTER — Encounter: Payer: Self-pay | Admitting: Plastic Surgery

## 2021-05-19 ENCOUNTER — Ambulatory Visit (INDEPENDENT_AMBULATORY_CARE_PROVIDER_SITE_OTHER): Payer: Self-pay | Admitting: Plastic Surgery

## 2021-05-19 DIAGNOSIS — Z719 Counseling, unspecified: Secondary | ICD-10-CM

## 2021-05-19 NOTE — Progress Notes (Signed)
HALO Treatment  ? ?Settings in media ?  ?Topical and/or Block: applied 30 min ? ?Post Care: instructions given ?

## 2021-05-25 ENCOUNTER — Encounter (HOSPITAL_COMMUNITY): Payer: Self-pay

## 2021-05-26 ENCOUNTER — Ambulatory Visit: Payer: Self-pay | Admitting: Plastic Surgery

## 2021-05-26 ENCOUNTER — Encounter: Payer: Self-pay | Admitting: Plastic Surgery

## 2021-05-26 DIAGNOSIS — Z719 Counseling, unspecified: Secondary | ICD-10-CM

## 2021-05-26 MED ORDER — DOXYCYCLINE HYCLATE 100 MG PO TABS: 100.0000 mg | ORAL_TABLET | Freq: Two times a day (BID) | ORAL | 0 refills | Status: AC

## 2021-05-26 NOTE — Progress Notes (Signed)
The patient is a 71 yrs old female here for follow up on her HALO.  She had a profound effect.  She still is swollen and red.  She is likely going to have a great result.  There is some redness on the right perioral area.  I will send in doxy to be safe.  I also gave her some laser balm to try.  Follow up in 2 weeks.  Call with any questions. ?

## 2021-06-06 ENCOUNTER — Ambulatory Visit (INDEPENDENT_AMBULATORY_CARE_PROVIDER_SITE_OTHER): Payer: Self-pay | Admitting: Plastic Surgery

## 2021-06-06 ENCOUNTER — Encounter: Payer: Self-pay | Admitting: Plastic Surgery

## 2021-06-06 DIAGNOSIS — Z719 Counseling, unspecified: Secondary | ICD-10-CM

## 2021-06-06 NOTE — Progress Notes (Signed)
Botulinum Toxin Procedure Note ? ?Procedure: Cosmetic botulinum toxin ?Pre-operative Diagnosis: Dynamic rhytides  ? ?Post-operative Diagnosis: Same ? ?Complications:  None ? ?Brief history: The patient desires botulinum toxin injection of her forehead. I discussed with the patient this proposed procedure of botulinum toxin injections, which is customized depending on the particular needs of the patient. It is performed on facial rhytids as a temporary correction. The alternatives were discussed with the patient. The risks were addressed including bleeding, scarring, infection, damage to deeper structures, asymmetry, and chronic pain, which may occur infrequently after a procedure. The individual's choice to undergo a surgical procedure is based on the comparison of risks to potential benefits. Other risks include unsatisfactory results, brow ptosis, eyelid ptosis, allergic reaction, temporary paralysis, which should go away with time, bruising, blurring disturbances and delayed healing. Botulinum toxin injections do not arrest the aging process or produce permanent tightening of the eyelid.  Operative intervention maybe necessary to maintain the results of a blepharoplasty or botulinum toxin. The patient understands and wishes to proceed. ? ?Procedure: The area was prepped with alcohol and dried with a clean gauze. Using a clean technique, the botulinum toxin was diluted with 1.25 cc of preservative-free normal saline which was slowly injected with an 18 gauge needle in a tuberculin syringes.  A 32 gauge needles were then used to inject the botulinum toxin. This mixture allow for an aliquot of 4 units per 0.1 cc in each injection site.   ? ?Subsequently the mixture was injected in the glabellar and forehead area with preservation of the temporal branch to the lateral eyebrow as well as into each lateral canthal area beginning from the lateral orbital rim medial to the zygomaticus major in 3 separate areas. A total  of 20 Units of botulinum toxin was used. The forehead and glabellar area was injected with care to inject intramuscular only while holding pressure on the supratrochlear vessels in each area during each injection on either side of the medial corrugators. The injection proceeded vertically superiorly to the medial 2/3 of the frontalis muscle and superior 2/3 of the lateral frontalis, again with preservation of the frontal branch. No complications were noted. Light pressure was held for 5 minutes. She was instructed explicitly in post-operative care. ? ?Botox ?LOT:  [id number] ?EXP:  05/2023 ?

## 2021-07-06 ENCOUNTER — Other Ambulatory Visit: Payer: Self-pay | Admitting: General Surgery

## 2021-07-06 DIAGNOSIS — Z1231 Encounter for screening mammogram for malignant neoplasm of breast: Secondary | ICD-10-CM

## 2021-08-31 ENCOUNTER — Ambulatory Visit
Admission: RE | Admit: 2021-08-31 | Discharge: 2021-08-31 | Disposition: A | Discharge status: Home / Self Care | Payer: Medicare PPO | Source: Ambulatory Visit | Attending: General Surgery | Admitting: General Surgery

## 2021-08-31 DIAGNOSIS — Z1231 Encounter for screening mammogram for malignant neoplasm of breast: Secondary | ICD-10-CM

## 2021-09-05 ENCOUNTER — Encounter: Payer: Self-pay | Admitting: Plastic Surgery

## 2021-09-05 ENCOUNTER — Ambulatory Visit (INDEPENDENT_AMBULATORY_CARE_PROVIDER_SITE_OTHER): Payer: Self-pay | Admitting: Plastic Surgery

## 2021-09-05 DIAGNOSIS — Z719 Counseling, unspecified: Secondary | ICD-10-CM

## 2021-09-05 NOTE — Progress Notes (Signed)
Botulinum Toxin Procedure Note  Procedure: Cosmetic botulinum toxin   Pre-operative Diagnosis: Dynamic rhytides   Post-operative Diagnosis: Same  Complications:  None  Brief history: The patient desires botulinum toxin injection of her forehead. I discussed with the patient this proposed procedure of botulinum toxin injections, which is customized depending on the particular needs of the patient. It is performed on facial rhytids as a temporary correction. The alternatives were discussed with the patient. The risks were addressed including bleeding, scarring, infection, damage to deeper structures, asymmetry, and chronic pain, which may occur infrequently after a procedure. The individual's choice to undergo a surgical procedure is based on the comparison of risks to potential benefits. Other risks include unsatisfactory results, brow ptosis, eyelid ptosis, allergic reaction, temporary paralysis, which should go away with time, bruising, blurring disturbances and delayed healing. Botulinum toxin injections do not arrest the aging process or produce permanent tightening of the eyelid.  Operative intervention maybe necessary to maintain the results of a blepharoplasty or botulinum toxin. The patient understands and wishes to proceed.  Procedure: The area was prepped with alcohol and dried with a clean gauze. Using a clean technique, the botulinum toxin was diluted with 1.25 cc of preservative-free normal saline which was slowly injected with an 18 gauge needle in a tuberculin syringes.  A 32 gauge needles were then used to inject the botulinum toxin. This mixture allow for an aliquot of 4 units per 0.1 cc in each injection site.    Subsequently the mixture was injected in the glabellar and forehead area with preservation of the temporal branch to the lateral eyebrow as well as into each lateral canthal area beginning from the lateral orbital rim medial to the zygomaticus major in 3 separate areas. A  total of 30 Units of botulinum toxin was used. The forehead and glabellar area was injected with care to inject intramuscular only while holding pressure on the supratrochlear vessels in each area during each injection on either side of the medial corrugators. The injection proceeded vertically superiorly to the medial 2/3 of the frontalis muscle and superior 2/3 of the lateral frontalis, again with preservation of the frontal branch. No complications were noted. Light pressure was held for 5 minutes. She was instructed explicitly in post-operative care.  Botox LOT:  [id number]  EXP:  2025-09  Preoperative Dx: hyperpigmentation left face  Postoperative Dx:  same  Procedure: laser to face   Anesthesia: none  Description of Procedure:  Risks and complications were explained to the patient. Consent was confirmed and signed. Eye protection was placed. Time out was called and all information was confirmed to be correct. The area  area was prepped with alcohol and wiped dry. The BBL laser was 515 nm. The three dark spots were lasered. The patient tolerated the procedure well and there were no complications. The patient is to follow up in 4 weeks.

## 2021-12-11 ENCOUNTER — Telehealth: Payer: Self-pay | Admitting: Plastic Surgery

## 2021-12-11 NOTE — Telephone Encounter (Signed)
LVM returning call to confirm appt tomorrow 11/7 at 11am with Dr. Marla Roe.

## 2021-12-12 ENCOUNTER — Ambulatory Visit (INDEPENDENT_AMBULATORY_CARE_PROVIDER_SITE_OTHER): Payer: Self-pay | Admitting: Plastic Surgery

## 2021-12-12 ENCOUNTER — Encounter: Payer: Self-pay | Admitting: Plastic Surgery

## 2021-12-12 DIAGNOSIS — Z719 Counseling, unspecified: Secondary | ICD-10-CM

## 2021-12-12 NOTE — Progress Notes (Signed)
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 I discussed with the patient this proposed procedure of botulinum toxin injections, which is customized depending on the particular needs of the patient. It is performed on facial rhytids as a temporary correction. The alternatives were discussed with the patient. The risks were addressed including bleeding, scarring, infection, damage to deeper structures, asymmetry, and chronic pain, which may occur infrequently after a procedure. The individual's choice to undergo a surgical procedure is based on the comparison of risks to potential benefits. Other risks include unsatisfactory results, brow ptosis, eyelid ptosis, allergic reaction, temporary paralysis, which should go away with time, bruising, blurring disturbances and delayed healing. Botulinum toxin injections do not arrest the aging process or produce permanent tightening of the eyelid.  Operative intervention maybe necessary to maintain the results of a blepharoplasty or botulinum toxin. The patient understands and wishes to proceed.  Procedure: The area was prepped with alcohol and dried with a clean gauze. Using a clean technique, the botulinum toxin was diluted with 1.25 cc of preservative-free normal saline which was slowly injected with an 18 gauge needle in a tuberculin syringes.  A 32 gauge needles were then used to inject the botulinum toxin. This mixture allow for an aliquot of 4 units per 0.1 cc in each injection site.    Subsequently the mixture was injected in the glabellar and forehead area with preservation of the temporal branch to the lateral eyebrow as well as into each lateral canthal area beginning from the lateral  orbital rim medial to the zygomaticus major in 3 separate areas. A total of 30 Units of botulinum toxin was used. The forehead and glabellar area was injected with care to inject intramuscular only while holding pressure on the supratrochlear vessels in each area during each injection on either side of the medial corrugators. The injection proceeded vertically superiorly to the medial 2/3 of the frontalis muscle and superior 2/3 of the lateral frontalis, again with preservation of the frontal branch.  The midface area was injected at the 3 sub-regions of the mid-face: zygomaticomalar region, anteromedial cheek region, and submalar region for a total of one syringe on each side of the face. The technique used was serial puncture with equal injections in the 3 sub-regions: the zygomaticomalar region, the anteromedial cheek, and the submalar region with the Contour. The nasolabial fold was injected with the restylane L with equal amounts on each side. No complications were noted. Light pressure was held for 5 minutes. She was instructed explicitly in post-operative care.  Botox LOT:  [id number]  EXP:  25/11  Restylane contour LOT: [id number] EXP: 24/05/31  Restylane L LOT: [id number] EXP: 2025/03

## 2022-03-16 ENCOUNTER — Ambulatory Visit (INDEPENDENT_AMBULATORY_CARE_PROVIDER_SITE_OTHER): Payer: Self-pay | Admitting: Plastic Surgery

## 2022-03-16 ENCOUNTER — Encounter: Payer: Self-pay | Admitting: Plastic Surgery

## 2022-03-16 DIAGNOSIS — Z719 Counseling, unspecified: Secondary | ICD-10-CM

## 2022-03-16 NOTE — Progress Notes (Signed)

## 2022-06-19 ENCOUNTER — Encounter: Payer: Self-pay | Admitting: Plastic Surgery

## 2022-06-19 ENCOUNTER — Ambulatory Visit (INDEPENDENT_AMBULATORY_CARE_PROVIDER_SITE_OTHER): Payer: Self-pay | Admitting: Plastic Surgery

## 2022-06-19 DIAGNOSIS — Z719 Counseling, unspecified: Secondary | ICD-10-CM

## 2022-06-19 NOTE — Progress Notes (Signed)

## 2022-06-25 ENCOUNTER — Encounter: Payer: Medicare PPO | Admitting: Plastic Surgery

## 2022-06-27 ENCOUNTER — Ambulatory Visit (INDEPENDENT_AMBULATORY_CARE_PROVIDER_SITE_OTHER): Payer: Self-pay | Admitting: Plastic Surgery

## 2022-06-27 DIAGNOSIS — Z719 Counseling, unspecified: Secondary | ICD-10-CM

## 2022-06-27 NOTE — Progress Notes (Signed)
Filler Injection Procedure Note  Procedure:  Filler administration  Pre-operative Diagnosis: Rytides   Post-operative Diagnosis: Same  Surgeon: Electronically signed by: Wayland Denis, DO   Complications:  None  Brief history: The patient desires injection with fillers in her face. I discussed with the patient this proposed procedure of filler injections, which is customized depending on the particular needs of the patient. It is performed on facial volume loss as a temporary correction. The alternatives were discussed with the patient. The risks were addressed including bleeding, scarring, infection, damage to deeper structures, asymmetry, and chronic pain, which may occur infrequently after a procedure. The individual's choice to undergo a surgical procedure is based on the comparison of risks to potential benefits. Other risks include unsatisfactory results, allergic reaction, which should go away with time, bruising and delayed healing. Fillers do not arrest the aging process or produce permanent tightening.  Operative intervention maybe necessary to maintain the results. The patient understands and wishes to proceed. An informed consent was signed and informational brochures given to her prior to the procedure.  Procedure: The area was prepped with chlorhexidine and dried with a clean gauze. Using a clean technique, a 30 gauge needle was then used to inject the filler into the nasal labial folds and perioral wrinkles. This was done with one syringe.   The midface area was injected at the medial sub-region of the mid-face.  No complications were noted. Light pressure was held for 5 minutes. She was instructed explicitly in post-operative care.  Restylane Defyne 1 syringe LOT: [id number]

## 2022-07-06 ENCOUNTER — Other Ambulatory Visit: Payer: Medicare PPO | Admitting: Plastic Surgery

## 2022-07-23 ENCOUNTER — Other Ambulatory Visit: Payer: Self-pay | Admitting: Obstetrics

## 2022-07-23 DIAGNOSIS — Z1231 Encounter for screening mammogram for malignant neoplasm of breast: Secondary | ICD-10-CM

## 2022-08-15 ENCOUNTER — Other Ambulatory Visit: Payer: Medicare PPO | Admitting: Plastic Surgery

## 2022-08-17 ENCOUNTER — Other Ambulatory Visit: Payer: Medicare PPO | Admitting: Plastic Surgery

## 2022-09-03 ENCOUNTER — Ambulatory Visit
Admission: RE | Admit: 2022-09-03 | Discharge: 2022-09-03 | Disposition: A | Discharge status: Home / Self Care | Payer: Medicare PPO | Source: Ambulatory Visit | Attending: Obstetrics | Admitting: Obstetrics

## 2022-09-03 DIAGNOSIS — Z1231 Encounter for screening mammogram for malignant neoplasm of breast: Secondary | ICD-10-CM

## 2022-09-06 ENCOUNTER — Other Ambulatory Visit: Payer: Self-pay | Admitting: Obstetrics

## 2022-09-06 DIAGNOSIS — R928 Other abnormal and inconclusive findings on diagnostic imaging of breast: Secondary | ICD-10-CM

## 2022-09-12 ENCOUNTER — Ambulatory Visit
Admission: RE | Admit: 2022-09-12 | Discharge: 2022-09-12 | Disposition: A | Discharge status: Home / Self Care | Payer: Medicare PPO | Source: Ambulatory Visit | Attending: Obstetrics | Admitting: Obstetrics

## 2022-09-12 DIAGNOSIS — R928 Other abnormal and inconclusive findings on diagnostic imaging of breast: Secondary | ICD-10-CM

## 2022-10-02 ENCOUNTER — Ambulatory Visit (INDEPENDENT_AMBULATORY_CARE_PROVIDER_SITE_OTHER): Payer: Self-pay | Admitting: Plastic Surgery

## 2022-10-02 ENCOUNTER — Encounter: Payer: Self-pay | Admitting: Plastic Surgery

## 2022-10-02 DIAGNOSIS — Z719 Counseling, unspecified: Secondary | ICD-10-CM

## 2022-10-02 NOTE — Progress Notes (Signed)
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 I discussed with the patient this proposed procedure of botulinum toxin injections, which is customized depending on the particular needs of the patient. It is performed on facial rhytids as a temporary correction. The alternatives were discussed with the patient. The risks were addressed including bleeding, scarring, infection, damage to deeper structures, asymmetry, and chronic pain, which may occur infrequently after a procedure. The individual's choice to undergo a surgical procedure is based on the comparison of risks to potential benefits. Other risks include unsatisfactory results, brow ptosis, eyelid ptosis, allergic reaction, temporary paralysis, which should go away with time, bruising, blurring disturbances and delayed healing. Botulinum toxin injections do not arrest the aging process or produce permanent tightening of the eyelid.  Operative intervention maybe necessary to maintain the results of a blepharoplasty or botulinum toxin. The patient understands and wishes to proceed.  Procedure: The area was prepped with alcohol and dried with a clean gauze. Using a clean technique, the botulinum toxin was diluted with 1.25 cc of preservative-free normal saline which was slowly injected with an 18 gauge needle in a tuberculin syringes.  A 32 gauge needles were then used to inject the botulinum toxin. This mixture allow for an aliquot of 4 units per 0.1 cc in each injection site.    Subsequently the mixture was injected in the glabellar and forehead area with preservation of the temporal branch to the lateral eyebrow as well as into each lateral canthal area beginning from the lateral  orbital rim medial to the zygomaticus major in 3 separate areas. A total of 32 Units of botulinum toxin was used. The forehead and glabellar area was injected with care to inject intramuscular only while holding pressure on the supratrochlear vessels in each area during each injection on either side of the medial corrugators. The injection proceeded vertically superiorly to the medial 2/3 of the frontalis muscle and superior 2/3 of the lateral frontalis, again with preservation of the frontal branch.  The nasolabial folds were injected with the Volbella for one syringe on each side. No complications were noted. Light pressure was held for 5 minutes. She was instructed explicitly in post-operative care.  Botox LOT:  [id number]   J. Volbella XC x 2 LOT: [id number]

## 2022-10-17 ENCOUNTER — Ambulatory Visit (INDEPENDENT_AMBULATORY_CARE_PROVIDER_SITE_OTHER): Payer: Self-pay | Admitting: Surgical

## 2022-10-17 DIAGNOSIS — Z719 Counseling, unspecified: Secondary | ICD-10-CM

## 2022-10-17 NOTE — Progress Notes (Signed)
Patient is a very pleasant 72 year old female here to discuss hyperpigmentation spots on bilateral cheeks and forehead.  She reports that these developed many years ago, she has had various laser treatments including halo laser with mild improvement.  Upon evaluating the patient, I am concerned that the darkening areas may be melasma and that a test spot with the BBL may darken the pigment.  Based on the distribution of the hyperpigmentation and the history provided, I think she would benefit from an evaluation with our aesthetician to discuss various skin care products such as vitamin C, hydroquinone, skin brightening serums with retinol.  I would recommend evaluation with her and will follow-up with patient after her consultation with [facility].  I discussed with her that the halo and Moxi lasers would be most beneficial for melasma like hyperpigmentation.  Patient was appreciative, she has an appointment for Monday at 2 PM with [facility]  Pictures were obtained of the patient and placed in the chart with the patient's or guardian's permission.

## 2022-10-22 ENCOUNTER — Encounter (INDEPENDENT_AMBULATORY_CARE_PROVIDER_SITE_OTHER): Payer: Self-pay

## 2022-10-22 ENCOUNTER — Encounter: Payer: Medicare PPO | Admitting: Plastic Surgery

## 2022-10-22 DIAGNOSIS — Z719 Counseling, unspecified: Secondary | ICD-10-CM

## 2022-11-20 DIAGNOSIS — Z719 Counseling, unspecified: Secondary | ICD-10-CM

## 2022-12-11 ENCOUNTER — Ambulatory Visit (INDEPENDENT_AMBULATORY_CARE_PROVIDER_SITE_OTHER): Payer: Self-pay | Admitting: Plastic Surgery

## 2022-12-11 DIAGNOSIS — Z719 Counseling, unspecified: Secondary | ICD-10-CM

## 2022-12-11 NOTE — Progress Notes (Signed)
   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 I discussed with the patient this proposed procedure of botulinum toxin injections, which is customized depending on the particular needs of the patient. It is performed on facial rhytids as a temporary correction. The alternatives were discussed with the patient. The risks were addressed including bleeding, scarring, infection, damage to deeper structures, asymmetry, and chronic pain, which may occur infrequently after a procedure. The individual's choice to undergo a surgical procedure is based on the comparison of risks to potential benefits. Other risks include unsatisfactory results, brow ptosis, eyelid ptosis, allergic reaction, temporary paralysis, which should go away with time, bruising, blurring disturbances and delayed healing. Botulinum toxin injections do not arrest the aging process or produce permanent tightening of the eyelid.  Operative intervention maybe necessary to maintain the results of a blepharoplasty or botulinum toxin. The patient understands and wishes to proceed.  Procedure: The area was prepped with alcohol and dried with a clean gauze. Using a clean technique, the botulinum toxin was diluted with 1.25 cc of preservative-free normal saline which was slowly injected with an 18 gauge needle in a tuberculin syringes.  A 32 gauge needles were then used to inject the botulinum toxin. This mixture allow for an aliquot of 4 units per 0.1 cc in each injection site.    Subsequently the mixture was injected in the glabellar and forehead area with preservation of the temporal branch to the lateral eyebrow as well as into each lateral canthal area beginning from the  lateral orbital rim medial to the zygomaticus major in 3 separate areas. A total of 32 Units of botulinum toxin was used. The forehead and glabellar area was injected with care to inject intramuscular only while holding pressure on the supratrochlear vessels in each area during each injection on either side of the medial corrugators. The injection proceeded vertically superiorly to the medial 2/3 of the frontalis muscle and superior 2/3 of the lateral frontalis, again with preservation of the frontal branch.  The nasolabial folds and perioral area was injected with one syringe.  No complications were noted. Light pressure was held for 5 minutes. She was instructed explicitly in post-operative care.  Botox LOT:  [id number]  Refyne 1 ml J. Voluma XC LOT: [id number]

## 2023-03-04 ENCOUNTER — Encounter: Payer: Self-pay | Admitting: Pediatrics

## 2023-03-12 ENCOUNTER — Encounter: Payer: Medicare PPO | Admitting: Plastic Surgery

## 2023-03-15 ENCOUNTER — Ambulatory Visit (INDEPENDENT_AMBULATORY_CARE_PROVIDER_SITE_OTHER): Payer: Self-pay | Admitting: Plastic Surgery

## 2023-03-15 ENCOUNTER — Encounter: Payer: Self-pay | Admitting: Plastic Surgery

## 2023-03-15 DIAGNOSIS — Z719 Counseling, unspecified: Secondary | ICD-10-CM

## 2023-03-15 NOTE — Progress Notes (Signed)
 Botulinum Toxin and Filler Injection Procedure Note  Procedure: Cosmetic botulinum toxin and Filler administration  Pre-operative Diagnosis: Dynamic rhytides   Post-operative Diagnosis: Same  Complications:  None  Brief history: The patient desires botulinum toxin injection of her forehead. I discussed with the patient this proposed procedure of botulinum toxin injections, which is customized depending on the particular needs of the patient. It is performed on facial rhytids as a temporary correction. The alternatives were discussed with the patient. The risks were addressed including bleeding, scarring, infection, damage to deeper structures, asymmetry, and chronic pain, which may occur infrequently after a procedure. The individual's choice to undergo a surgical procedure is based on the comparison of risks to potential benefits. Other risks include unsatisfactory results, brow ptosis, eyelid ptosis, allergic reaction, temporary paralysis, which should go away with time, bruising, blurring disturbances and delayed healing. Botulinum toxin injections do not arrest the aging process or produce permanent tightening of the eyelid.  Operative intervention maybe necessary to maintain the results of a blepharoplasty or botulinum toxin. The patient understands and wishes to proceed.  Procedure: The area was prepped with alcohol and dried with a clean gauze. Using a clean technique, the botulinum toxin was diluted with 1.25 cc of preservative-free normal saline which was slowly injected with an 18 gauge needle in a tuberculin syringes.  A 32 gauge needles were then used to inject the botulinum toxin. This mixture allow for an aliquot of 4 units per 0.1 cc in each injection site.    Subsequently the mixture was injected in the glabellar and forehead area with preservation of the temporal branch to the lateral eyebrow as well as into each lateral canthal area beginning from the lateral orbital rim medial to the  zygomaticus major in 3 separate areas. A total of 40 Units of botulinum toxin was used. The forehead and glabellar area was injected with care to inject intramuscular only while holding pressure on the supratrochlear vessels in each area during each injection on either side of the medial corrugators. The injection proceeded vertically superiorly to the medial 2/3 of the frontalis muscle and superior 2/3 of the lateral frontalis, again with preservation of the frontal branch.  The nasolabial folds and the perioral wrinkles were injected equally on each side.  No complications were noted. Light pressure was held for 5 minutes. She was instructed explicitly in post-operative care.  Botox LOT:  [id number]   Juvederm Plus 1 cc LOT: [id number]

## 2023-03-29 ENCOUNTER — Ambulatory Visit: Payer: Medicare PPO

## 2023-03-29 VITALS — Ht 66.0 in | Wt 125.0 lb

## 2023-03-29 DIAGNOSIS — Z1211 Encounter for screening for malignant neoplasm of colon: Secondary | ICD-10-CM

## 2023-03-29 MED ORDER — SUFLAVE 178.7 G PO SOLR: 1.0000 | ORAL | 0 refills | Status: DC

## 2023-03-29 NOTE — Progress Notes (Signed)

## 2023-04-11 ENCOUNTER — Encounter: Payer: Self-pay | Admitting: Pediatrics

## 2023-04-15 NOTE — Progress Notes (Unsigned)
 [facility] History and Physical   Primary Care Physician:  Barbie Banner, MD   Reason for Procedure:  Colon cancer screening  Plan:    Screening colonoscopy   HPI: Jaanai Salemi is a 73 y.o. female undergoing screening colonoscopy for colorectal cancer screening.  Last colonoscopy performed in 2015 was endoscopically normal without polyps.  Biopsies were consistent with lymphocytic colitis.  Colonoscopy in 2003 showed what appeared to be inflammatory changes mostly in the rectum, however, biopsies were normal.  No family history of colorectal cancer.  Brother had esophageal cancer.   Past Medical History:  Diagnosis Date   Arthritis    Colitis    Complication of anesthesia    History of kidney stones    Interstitial cystitis    PONV (postoperative nausea and vomiting)    UTI (lower urinary tract infection)     Past Surgical History:  Procedure Laterality Date   ABDOMINAL HYSTERECTOMY     partial   APPENDECTOMY  1991   BREAST BIOPSY     BREAST EXCISIONAL BIOPSY Right 05/2021   BREAST LUMPECTOMY WITH RADIOACTIVE SEED LOCALIZATION Right 05/10/2021   Procedure: RIGHT BREAST LUMPECTOMY WITH RADIOACTIVE SEED LOCALIZATION;  Surgeon: Griselda Miner, MD;  Location: [facility];  Service: General;  Laterality: Right;   COLONOSCOPY N/A 02/12/2013   Procedure: COLONOSCOPY;  Surgeon: Hart Carwin, MD;  Location: [facility];  Service: Endoscopy;  Laterality: N/A;   JOINT REPLACEMENT     PARTIAL HYSTERECTOMY  1990   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 Authorizing Provider  butalbital-acetaminophen-caffeine (FIORICET, ESGIC) 50-325-40 MG tablet Take 1-2 tablets by mouth every 6 (six) hours as needed for migraine.  10/10/16   [provider]  Cetirizine HCl (ZYRTEC ALLERGY) 10 MG CAPS Take 1 capsule by mouth daily at 6 (six) AM.    [provider]  Cholecalciferol (VITAMIN D3) 1.25 MG (50000 UT) TABS Take 50 mcg by mouth daily at 6 (six) AM.    [provider]  clonazePAM (KLONOPIN) 0.5 MG tablet Take 0.5 mg by mouth as needed. 07/27/20   [provider]  estradiol (ESTRACE) 0.5 MG tablet Take 0.5 mg by mouth daily. 05/11/17   [provider]  fluconazole (DIFLUCAN) 150 MG tablet Take 150 mg by mouth as needed.    [provider]  FLUoxetine (PROZAC) 40 MG capsule Take 40 mg by mouth daily.    [provider]  Lactobacillus-Inulin (CULTURELLE DIGESTIVE HEALTH) CAPS Take 1 capsule by mouth daily at 6 (six) AM. 01/15/23   [provider]  LORazepam (ATIVAN) 0.5 MG tablet Take 0.5 mg by mouth 2 (two) times daily as needed for anxiety.    [provider]  PEG 3350-KCl-NaCl-NaSulf-MgSul (SUFLAVE) 178.7 g SOLR Take 1 kit by mouth as directed. 03/29/23   Ottie Glazier, MD  pneumococcal 13-valent conjugate vaccine (PREVNAR 13) SUSP injection Prevnar 13 (PF) 0.5 mL intramuscular syringe  TO BE ADMINISTERED BY PHARMACIST FOR IMMUNIZATION    [provider]  terconazole (TERAZOL 7) 0.4 % vaginal cream Place 1 applicator  vaginally as needed.    [provider]  zolpidem (AMBIEN) 10 MG tablet Take 5-10 mg by mouth at bedtime as needed for sleep. 06/04/17   [provider]    Current Outpatient Medications  Medication Sig Dispense Refill   butalbital-acetaminophen-caffeine (FIORICET, ESGIC) 50-325-40 MG tablet Take 1-2 tablets by mouth every 6 (six) hours as needed for migraine.   2   Cetirizine HCl (ZYRTEC ALLERGY) 10 MG CAPS Take 1  capsule by mouth daily at 6 (six) AM.     Cholecalciferol (VITAMIN D3) 1.25 MG (50000 UT) TABS Take 50 mcg by mouth daily at 6 (six) AM.     clonazePAM (KLONOPIN) 0.5 MG tablet Take 0.5 mg by mouth as needed.     estradiol (ESTRACE) 0.5 MG tablet Take 0.5 mg by mouth daily.  3   fluconazole (DIFLUCAN) 150 MG tablet Take 150 mg by mouth as needed.     FLUoxetine (PROZAC) 40 MG capsule Take 40 mg by mouth daily.     Lactobacillus-Inulin (CULTURELLE DIGESTIVE HEALTH) CAPS Take 1 capsule by mouth daily at 6 (six) AM.     LORazepam (ATIVAN) 0.5 MG tablet Take 0.5 mg by mouth 2 (two) times daily as needed for anxiety.     PEG 3350-KCl-NaCl-NaSulf-MgSul (SUFLAVE) 178.7 g SOLR Take 1 kit by mouth as directed. 1 each 0   pneumococcal 13-valent conjugate vaccine (PREVNAR 13) SUSP injection Prevnar 13 (PF) 0.5 mL intramuscular syringe  TO BE ADMINISTERED BY PHARMACIST FOR IMMUNIZATION     terconazole (TERAZOL 7) 0.4 % vaginal cream Place 1 applicator vaginally as needed.     zolpidem (AMBIEN) 10 MG tablet Take 5-10 mg by mouth at bedtime as needed for sleep.  0   No current facility-administered medications for this visit.    Allergies as of 04/16/2023   (No Known Allergies)    Family History  Problem Relation Age of Onset   Prostate cancer Father    Esophageal cancer Brother 67   Colon cancer Neg Hx    Colon polyps Neg Hx    Stomach cancer Neg Hx    Rectal cancer Neg Hx     Social History   Socioeconomic History   Marital status: Married    Spouse name: Not on file   Number of children: Not on file   Years of education: Not on file   Highest education level: Not on file  Occupational History   Not on file  Tobacco Use   Smoking status: Never   Smokeless tobacco: Never  Vaping Use   Vaping status: Never Used  Substance and Sexual Activity   Alcohol use: No   Drug use: No   Sexual activity: Not on file  Other Topics Concern   Not on file  Social History Narrative   Not on  file   Social Drivers of Health   Financial Resource Strain: Not on file  Food Insecurity: Low Risk  (01/15/2023)   Received from [facility]   Hunger Vital Sign    Worried About Running Out of Food in the Last Year: Never true    Ran Out of Food in the Last Year: Never true  Transportation Needs: No Transportation Needs (01/15/2023)   Received from [facility]    In the past 12 months, has lack of reliable transportation kept you from medical appointments, meetings, work or from getting things needed for daily living? : No  Physical Activity: Not on file  Stress: Not  on file  Social Connections: Not on file  Intimate Partner Violence: Not on file    Review of Systems:  All other review of systems negative except as mentioned in the HPI.  Physical Exam: Vital signs There were no vitals taken for this visit.  General:   Alert,  Well-developed, well-nourished, pleasant and cooperative in NAD Airway:  Mallampati  Lungs:  Clear throughout to auscultation.   Heart:  Regular rate and rhythm; no murmurs, clicks, rubs,  or gallops. Abdomen:  Soft, nontender and nondistended. Normal bowel sounds.   Neuro/Psych:  Normal mood and affect. A and O x 3  Maren Beach, MD [facility]

## 2023-04-16 ENCOUNTER — Ambulatory Visit: Payer: Medicare PPO | Admitting: Pediatrics

## 2023-04-16 ENCOUNTER — Encounter: Payer: Self-pay | Admitting: Pediatrics

## 2023-04-16 VITALS — BP 105/66 | HR 78 | Temp 97.8°F | Resp 16 | Ht 66.0 in | Wt 125.0 lb

## 2023-04-16 DIAGNOSIS — D124 Benign neoplasm of descending colon: Secondary | ICD-10-CM | POA: Diagnosis not present

## 2023-04-16 DIAGNOSIS — Z1211 Encounter for screening for malignant neoplasm of colon: Secondary | ICD-10-CM | POA: Diagnosis not present

## 2023-04-16 DIAGNOSIS — K648 Other hemorrhoids: Secondary | ICD-10-CM | POA: Diagnosis not present

## 2023-04-16 DIAGNOSIS — K649 Unspecified hemorrhoids: Secondary | ICD-10-CM

## 2023-04-16 DIAGNOSIS — D120 Benign neoplasm of cecum: Secondary | ICD-10-CM

## 2023-04-16 MED ORDER — SODIUM CHLORIDE 0.9 % IV SOLN: 500.0000 mL | Freq: Once | INTRAVENOUS | Status: DC

## 2023-04-16 NOTE — Progress Notes (Signed)
 Sedate, gd SR, tolerated procedure well, VSS, report to RN

## 2023-04-16 NOTE — Patient Instructions (Signed)

## 2023-04-16 NOTE — Progress Notes (Signed)
 Pt's states no medical or surgical changes since previsit or office visit.

## 2023-04-16 NOTE — Op Note (Signed)
 [facility] Patient Name: Brandi Hines Procedure Date: 04/16/2023 9:31 AM MRN: [id number] Endoscopist: Maren Beach , MD, [id number] Age: 73 Referring MD:  Date of Birth: 07-08-1950 Gender: Female Account #: [id number] Procedure:                Colonoscopy Indications:              Screening for colorectal malignant neoplasm, Last                            colonoscopy: 2015; history of microscopic colitis                            on colon biopsies 2015?"no current symptoms Medicines:                Monitored Anesthesia Care Procedure:                Pre-Anesthesia Assessment:                           - Prior to the procedure, a History and Physical                            was performed, and patient medications and                            allergies were reviewed. The patient's tolerance of                            previous anesthesia was also reviewed. The risks                            and benefits of the procedure and the sedation                            options and risks were discussed with the patient.                            All questions were answered, and informed consent                            was obtained. Prior Anticoagulants: The patient has                            taken no anticoagulant or antiplatelet agents. ASA                            Grade Assessment: II - A patient with mild systemic                            disease. After reviewing the risks and benefits,                            the patient was deemed in satisfactory condition to  undergo the procedure.                           After obtaining informed consent, the colonoscope                            was passed under direct vision. Throughout the                            procedure, the patient's blood pressure, pulse, and                            oxygen saturations were monitored continuously. The                            Olympus Scope  [id number] was introduced through the                            anus and advanced to the cecum, identified by                            appendiceal orifice and ileocecal valve. The                            colonoscopy was performed without difficulty. The                            patient tolerated the procedure well. The quality                            of the bowel preparation was good. The ileocecal                            valve, appendiceal orifice, and rectum were                            photographed. Scope In: 9:45:43 AM Scope Out: 10:00:09 AM Scope Withdrawal Time: 0 hours 10 minutes 9 seconds  Total Procedure Duration: 0 hours 14 minutes 26 seconds  Findings:                 Hemorrhoids were found on perianal exam.                           The digital rectal exam was normal. Pertinent                            negatives include normal sphincter tone and no                            palpable rectal lesions.                           A 4 mm polyp was found in the cecum. The polyp was  sessile. The polyp was removed with a cold snare.                            Resection and retrieval were complete.                           A 3 mm polyp was found in the descending colon. The                            polyp was sessile. The polyp was removed with a                            cold biopsy forceps. Resection and retrieval were                            complete.                           Internal hemorrhoids were found during retroflexion. Complications:            No immediate complications. Estimated blood loss:                            Minimal. Estimated Blood Loss:     Estimated blood loss was minimal. Impression:               - Hemorrhoids found on perianal exam.                           - One 4 mm polyp in the cecum, removed with a cold                            snare. Resected and retrieved.                           - One 3 mm  polyp in the descending colon, removed                            with a cold biopsy forceps. Resected and retrieved.                           - Internal hemorrhoids. Maren Beach, MD 04/16/2023 10:05:26 AM This report has been signed electronically.

## 2023-04-16 NOTE — Progress Notes (Signed)
 Called to room to assist during endoscopic procedure.  Patient ID and intended procedure confirmed with present staff. Received instructions for my participation in the procedure from the performing physician.

## 2023-04-18 ENCOUNTER — Telehealth: Payer: Self-pay | Admitting: *Deleted

## 2023-04-18 NOTE — Telephone Encounter (Signed)
  Follow up Call-     04/16/2023    9:03 AM  Call back number  Post procedure Call Back phone  # [phone]  Permission to leave phone message Yes     Patient questions:  Do you have a fever, pain , or abdominal swelling? No. Pain Score  0 *  Have you tolerated food without any problems? Yes.    Have you been able to return to your normal activities? Yes.    Do you have any questions about your discharge instructions: Diet   No. Medications  No. Follow up visit  No.  Do you have questions or concerns about your Care? No.  Actions: * If pain score is 4 or above: No action needed, pain <4.

## 2023-04-22 ENCOUNTER — Encounter: Payer: Self-pay | Admitting: Pediatrics

## 2023-04-22 LAB — SURGICAL PATHOLOGY

## 2023-06-11 ENCOUNTER — Ambulatory Visit (INDEPENDENT_AMBULATORY_CARE_PROVIDER_SITE_OTHER): Payer: Medicare PPO | Admitting: Plastic Surgery

## 2023-06-11 DIAGNOSIS — Z719 Counseling, unspecified: Secondary | ICD-10-CM

## 2023-06-11 NOTE — Progress Notes (Signed)

## 2023-07-24 ENCOUNTER — Other Ambulatory Visit: Payer: Self-pay | Admitting: Family Medicine

## 2023-07-24 DIAGNOSIS — Z1231 Encounter for screening mammogram for malignant neoplasm of breast: Secondary | ICD-10-CM

## 2023-09-13 ENCOUNTER — Ambulatory Visit

## 2023-09-20 ENCOUNTER — Ambulatory Visit (INDEPENDENT_AMBULATORY_CARE_PROVIDER_SITE_OTHER): Payer: Self-pay | Admitting: Plastic Surgery

## 2023-09-20 ENCOUNTER — Ambulatory Visit
Admission: RE | Admit: 2023-09-20 | Discharge: 2023-09-20 | Disposition: A | Discharge status: Home / Self Care | Source: Ambulatory Visit | Attending: Family Medicine | Admitting: Family Medicine

## 2023-09-20 ENCOUNTER — Encounter: Payer: Self-pay | Admitting: Plastic Surgery

## 2023-09-20 VITALS — BP 105/72 | HR 66

## 2023-09-20 DIAGNOSIS — Z719 Counseling, unspecified: Secondary | ICD-10-CM

## 2023-09-20 DIAGNOSIS — Z1231 Encounter for screening mammogram for malignant neoplasm of breast: Secondary | ICD-10-CM

## 2023-09-20 NOTE — Progress Notes (Signed)

## 2023-12-20 ENCOUNTER — Encounter: Payer: Self-pay | Admitting: Plastic Surgery

## 2023-12-20 ENCOUNTER — Ambulatory Visit (INDEPENDENT_AMBULATORY_CARE_PROVIDER_SITE_OTHER): Payer: Self-pay | Admitting: Plastic Surgery

## 2023-12-20 DIAGNOSIS — Z719 Counseling, unspecified: Secondary | ICD-10-CM

## 2023-12-20 NOTE — Progress Notes (Signed)

## 2024-02-25 ENCOUNTER — Ambulatory Visit (INDEPENDENT_AMBULATORY_CARE_PROVIDER_SITE_OTHER): Payer: Self-pay | Admitting: Plastic Surgery

## 2024-02-25 ENCOUNTER — Other Ambulatory Visit (HOSPITAL_COMMUNITY): Payer: Self-pay

## 2024-02-25 DIAGNOSIS — Z719 Counseling, unspecified: Secondary | ICD-10-CM

## 2024-02-25 MED ORDER — LIDOCAINE 23% - TETRACAINE 7% TOPICAL OINTMENT (PLASTICIZED)
1.0000 | TOPICAL_OINTMENT | Freq: Once | CUTANEOUS | 0 refills | Status: AC
  Filled 2024-02-25: qty 60, 1d supply, fill #0

## 2024-02-25 NOTE — Progress Notes (Signed)
 Preoperative Dx: Hyperpigmentation of face  Postoperative Dx:  same  Procedure: laser to face  Anesthesia: none  Description of Procedure:  Risks and complications were explained to the patient. Consent was confirmed and signed. Eye protection was placed. Time out was called and all information was confirmed to be correct. The area  area was prepped with alcohol and wiped dry. The BBL laser was set at 515 nm at 11 J/cm2. The face lesions were lasered. The patient tolerated the procedure well and there were no complications. The patient is to follow up in 4 weeks.

## 2024-02-25 NOTE — Addendum Note (Signed)
 Addended by: LOWERY ESTEFANA RAMAN on: 02/25/2024 10:28 AM   Modules accepted: Orders

## 2024-03-05 ENCOUNTER — Other Ambulatory Visit (HOSPITAL_COMMUNITY): Payer: Self-pay

## 2024-03-10 ENCOUNTER — Telehealth: Payer: Self-pay | Admitting: Plastic Surgery

## 2024-03-10 NOTE — Telephone Encounter (Signed)
 provider out of office called pt to r/s and lvmail  Botox

## 2024-03-16 ENCOUNTER — Encounter: Admitting: Plastic Surgery

## 2024-03-20 ENCOUNTER — Encounter: Admitting: Plastic Surgery

## 2024-03-31 ENCOUNTER — Other Ambulatory Visit: Admitting: Plastic Surgery
